# Patient Record
Sex: Male | Born: 1960 | Race: White | Hispanic: No | Smoking: Current every day smoker
Health system: Southern US, Community
[De-identification: ages and names within clinical notes are randomized; demographics above are authoritative.]

## PROBLEM LIST (undated history)

## (undated) DIAGNOSIS — E119 Type 2 diabetes mellitus without complications: Secondary | ICD-10-CM

## (undated) DIAGNOSIS — F410 Panic disorder [episodic paroxysmal anxiety] without agoraphobia: Secondary | ICD-10-CM

## (undated) DIAGNOSIS — F32A Depression, unspecified: Secondary | ICD-10-CM

## (undated) DIAGNOSIS — F419 Anxiety disorder, unspecified: Secondary | ICD-10-CM

## (undated) DIAGNOSIS — E8881 Metabolic syndrome: Secondary | ICD-10-CM

## (undated) DIAGNOSIS — F329 Major depressive disorder, single episode, unspecified: Secondary | ICD-10-CM

## (undated) DIAGNOSIS — E785 Hyperlipidemia, unspecified: Secondary | ICD-10-CM

## (undated) DIAGNOSIS — I517 Cardiomegaly: Secondary | ICD-10-CM

## (undated) DIAGNOSIS — D126 Benign neoplasm of colon, unspecified: Secondary | ICD-10-CM

## (undated) DIAGNOSIS — I4891 Unspecified atrial fibrillation: Secondary | ICD-10-CM

## (undated) HISTORY — PX: COLONOSCOPY W/ POLYPECTOMY: SHX1380

## (undated) HISTORY — DX: Cardiomegaly: I51.7

## (undated) HISTORY — DX: Type 2 diabetes mellitus without complications: E11.9

## (undated) HISTORY — DX: Unspecified atrial fibrillation: I48.91

## (undated) HISTORY — DX: Metabolic syndrome: E88.810

## (undated) HISTORY — DX: Hyperlipidemia, unspecified: E78.5

## (undated) HISTORY — PX: ORIF ANKLE FRACTURE: SUR919

## (undated) HISTORY — DX: Major depressive disorder, single episode, unspecified: F32.9

## (undated) HISTORY — DX: Benign neoplasm of colon, unspecified: D12.6

## (undated) HISTORY — DX: Metabolic syndrome: E88.81

## (undated) HISTORY — DX: Depression, unspecified: F32.A

## (undated) HISTORY — DX: Anxiety disorder, unspecified: F41.9

## (undated) HISTORY — DX: Panic disorder (episodic paroxysmal anxiety): F41.0

---

## 2001-08-27 ENCOUNTER — Emergency Department (HOSPITAL_COMMUNITY): Admission: EM | Admit: 2001-08-27 | Discharge: 2001-08-27 | Payer: Self-pay | Admitting: Emergency Medicine

## 2001-08-29 ENCOUNTER — Encounter: Admission: RE | Admit: 2001-08-29 | Discharge: 2001-08-29 | Payer: Self-pay | Admitting: Family Medicine

## 2001-08-29 ENCOUNTER — Encounter: Payer: Self-pay | Admitting: Family Medicine

## 2001-10-11 ENCOUNTER — Encounter: Payer: Self-pay | Admitting: Family Medicine

## 2001-10-11 ENCOUNTER — Encounter: Admission: RE | Admit: 2001-10-11 | Discharge: 2001-10-11 | Payer: Self-pay | Admitting: Family Medicine

## 2001-10-12 ENCOUNTER — Emergency Department (HOSPITAL_COMMUNITY): Admission: EM | Admit: 2001-10-12 | Discharge: 2001-10-12 | Payer: Self-pay | Admitting: Emergency Medicine

## 2002-06-12 ENCOUNTER — Emergency Department (HOSPITAL_COMMUNITY): Admission: EM | Admit: 2002-06-12 | Discharge: 2002-06-12 | Payer: Self-pay | Admitting: *Deleted

## 2002-06-12 ENCOUNTER — Encounter: Payer: Self-pay | Admitting: Emergency Medicine

## 2005-04-21 ENCOUNTER — Encounter: Payer: Self-pay | Admitting: Cardiology

## 2005-11-21 ENCOUNTER — Emergency Department (HOSPITAL_COMMUNITY): Admission: EM | Admit: 2005-11-21 | Discharge: 2005-11-22 | Payer: Self-pay | Admitting: Emergency Medicine

## 2010-02-08 ENCOUNTER — Encounter: Payer: Self-pay | Admitting: Cardiology

## 2010-02-10 ENCOUNTER — Encounter: Payer: Self-pay | Admitting: Cardiology

## 2010-03-15 ENCOUNTER — Inpatient Hospital Stay (HOSPITAL_COMMUNITY): Admission: EM | Admit: 2010-03-15 | Discharge: 2010-03-18 | Payer: Self-pay | Admitting: Emergency Medicine

## 2010-05-06 ENCOUNTER — Encounter: Payer: Self-pay | Admitting: Cardiology

## 2010-05-06 DIAGNOSIS — E785 Hyperlipidemia, unspecified: Secondary | ICD-10-CM | POA: Insufficient documentation

## 2010-05-06 DIAGNOSIS — I517 Cardiomegaly: Secondary | ICD-10-CM | POA: Insufficient documentation

## 2010-05-06 DIAGNOSIS — E8881 Metabolic syndrome: Secondary | ICD-10-CM | POA: Insufficient documentation

## 2010-05-06 DIAGNOSIS — F410 Panic disorder [episodic paroxysmal anxiety] without agoraphobia: Secondary | ICD-10-CM | POA: Insufficient documentation

## 2010-05-06 DIAGNOSIS — F341 Dysthymic disorder: Secondary | ICD-10-CM | POA: Insufficient documentation

## 2010-05-06 DIAGNOSIS — I4891 Unspecified atrial fibrillation: Secondary | ICD-10-CM | POA: Insufficient documentation

## 2010-05-09 ENCOUNTER — Ambulatory Visit: Payer: Self-pay | Admitting: Cardiology

## 2010-05-09 ENCOUNTER — Encounter: Payer: Self-pay | Admitting: Cardiology

## 2010-05-23 ENCOUNTER — Ambulatory Visit: Payer: Self-pay

## 2010-05-23 ENCOUNTER — Ambulatory Visit (HOSPITAL_COMMUNITY)
Admission: RE | Admit: 2010-05-23 | Discharge: 2010-05-23 | Payer: Self-pay | Source: Home / Self Care | Admitting: Cardiology

## 2010-05-23 ENCOUNTER — Encounter: Payer: Self-pay | Admitting: Cardiology

## 2010-06-15 ENCOUNTER — Ambulatory Visit: Payer: Self-pay | Admitting: Cardiology

## 2010-07-19 NOTE — Letter (Signed)
Summary: [facility]  [facility]   Imported By: Marylou Mccoy 05/19/2010 14:56:01  _____________________________________________________________________  External Attachment:    Type:   Image     Comment:   External Document
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
## 2010-07-19 NOTE — Letter (Signed)
Summary: [facility]  [facility]   Imported By: Marylou Mccoy 05/19/2010 14:43:50  _____________________________________________________________________  External Attachment:    Type:   Image     Comment:   External Document

## 2010-07-21 NOTE — Assessment & Plan Note (Signed)
Summary: f6-8w/dfg   Visit Type:  2 mo f/u Referring Provider:  Dr. Tanya Nones Primary Provider:  Dr. Tanya Nones  CC:  denies any cardiac complaints today.  History of Present Illness: Steven Klein returns today for paroxysmal atrial fibrillation, new onset hypertension.  His echocardiogram showed some mild LVH and a left atrial size of 39 mm. This is upper limits of normal.  We took him off anticoagulation and put him on aspirin.  He's had no further symptoms of atrial fib. Stress levels have dropped.  Current Medications (verified): 1)  Crestor 20 Mg Tabs (Rosuvastatin Calcium) .Marland Kitchen.. 1 Tab At Bedtime 2)  Tricor 145 Mg Tabs (Fenofibrate) .Marland Kitchen.. 1 Tab Once Daily 3)  Paxil Cr 25 Mg Xr24h-Tab (Paroxetine Hcl) .Marland Kitchen.. 1 Tab Once Daily 4)  Metoprolol Succinate 25 Mg Xr24h-Tab (Metoprolol Succinate) .Marland Kitchen.. 1 Tab Once Daily 5)  Aspirin Ec 325 Mg Tbec (Aspirin) .... Take One Tablet By Mouth Daily 6)  Alprazolam 0.5 Mg Tabs (Alprazolam) .... 1/2 Tab As Needed  Allergies: 1)  ! Sulfa  Past History:  Past Medical History: Last updated: 05/06/2010 ATRIAL FIBRILLATION (ICD-427.31) CARDIOMEGALY (ICD-429.3) SHORTNESS OF BREATH (ICD-786.05) METABOLIC SYNDROME X (ICD-277.7) HYPERLIPIDEMIA (ICD-272.4) PANIC ATTACK (ICD-300.01) COLONIC POLYPS (ICD-211.3) DEPRESSION/ANXIETY (ICD-300.4)  Past Surgical History: Last updated: 05/06/2010  1. Open reduction left tibio-talar (ankle)dislocation.   2. Open reduction of left subtalar and talonavicular dislocations.   3. Open reduction and internal fixation of left talar head fracture.   4. Open reduction and internal fixation of left talar dome fracture.   5. Intraoperative interpretation of fluoroscopic images greater than 1       hour.   6. Microfracture of the talar dome.      SURGEON:  Toni Arthurs, MD   Family History: Last updated: 05/06/2010 He does not know his biologic parents, and he says that   otherwise he does not have any history of cardiac  disease or diabetes  known in the family.  He was basically adopted by his grandparents.   Social History: Last updated: 05/06/2010  He smokes approximately 1-1/2.  A tobacco intervention   session was performed, and I recommended that he quit smoking.  He will  be using a patch while he is here in the hospital, and hopefully will be able to cut down over the course of time.        Review of Systems       negative history of present illness  Vital Signs:  Patient profile:   50 year old male Height:      67 inches Weight:      236.25 pounds BMI:     37.14 Pulse rate:   58 / minute Pulse rhythm:   irregular BP sitting:   112 / 72  (left arm) Cuff size:   large  Vitals Entered By: Danielle Rankin, CMA (June 15, 2010 9:40 AM)  Physical Exam  General:  obese.   Head:  normocephalic and atraumatic Eyes:  PERRLA/EOM intact; conjunctiva and lids normal. Neck:  Neck supple, no JVD. No masses, thyromegaly or abnormal cervical nodes. Chest Steven Klein:  no deformities or breast masses noted Lungs:  Clear bilaterally to auscultation and percussion. Heart:  PMI nondisplaced, regular rate and rhythm, normal S1-S2, no carotid bruits Msk:  Back normal, normal gait. Muscle strength and tone normal. Pulses:  pulses normal in all 4 extremities Extremities:  No clubbing or cyanosis. Neurologic:  Alert and oriented x 3. Psych:  Normal affect.   Impression &  Recommendations:  Problem # 1:  ATRIAL FIBRILLATION (ICD-427.31) Assessment Improved no change in treatment. Unless he has recurrent atrial fib, we'll see him back p.r.n. Blood pressure control is essential as he and I and his wife discussed today. His updated medication list for this problem includes:    Metoprolol Succinate 25 Mg Xr24h-tab (Metoprolol succinate) .Marland Kitchen... 1 tab once daily    Aspirin Ec 325 Mg Tbec (Aspirin) .Marland Kitchen... Take one tablet by mouth daily  Appended Document: f6-8w/dfg    Clinical Lists Changes  Observations: Added  new observation of PI CARDIO: Your physician recommends that you schedule a follow-up appointment in: as needed with Dr. Daleen Squibb Your physician recommends that you continue on your current medications as directed. Please refer to the Current Medication list given to you today. Your physician has requested that you regularly monitor and record your blood pressure readings at home.  Please use the same machine at the same time of day to check your readings. (06/15/2010 10:02)       Patient Instructions: 1)  Your physician recommends that you schedule a follow-up appointment in: as needed with Dr. Daleen Squibb 2)  Your physician recommends that you continue on your current medications as directed. Please refer to the Current Medication list given to you today. 3)  Your physician has requested that you regularly monitor and record your blood pressure readings at home.  Please use the same machine at the same time of day to check your readings.

## 2010-09-01 LAB — CBC
HCT: 38.7 % — ABNORMAL LOW (ref 39.0–52.0)
HCT: 43.3 % (ref 39.0–52.0)
Hemoglobin: 12.9 g/dL — ABNORMAL LOW (ref 13.0–17.0)
MCHC: 33.3 g/dL (ref 30.0–36.0)
MCHC: 35.2 g/dL (ref 30.0–36.0)
MCV: 86.9 fL (ref 78.0–100.0)
MCV: 88.4 fL (ref 78.0–100.0)
Platelets: 235 10*3/uL (ref 150–400)
RBC: 4.98 MIL/uL (ref 4.22–5.81)
RDW: 12.8 % (ref 11.5–15.5)
RDW: 13 % (ref 11.5–15.5)
WBC: 15.4 10*3/uL — ABNORMAL HIGH (ref 4.0–10.5)
WBC: 17.1 10*3/uL — ABNORMAL HIGH (ref 4.0–10.5)
WBC: 22.1 10*3/uL — ABNORMAL HIGH (ref 4.0–10.5)

## 2010-09-01 LAB — BASIC METABOLIC PANEL
BUN: 9 mg/dL (ref 6–23)
BUN: 9 mg/dL (ref 6–23)
Calcium: 9.4 mg/dL (ref 8.4–10.5)
Chloride: 103 mEq/L (ref 96–112)
Chloride: 98 mEq/L (ref 96–112)
GFR calc Af Amer: >60 mL/min (ref >60)
GFR calc non Af Amer: >60 mL/min (ref >60)
GFR calc non Af Amer: >60 mL/min (ref >60)
GFR calc non Af Amer: >60 mL/min (ref >60)
Glucose, Bld: 130 mg/dL — ABNORMAL HIGH (ref 70–99)
Glucose, Bld: 141 mg/dL — ABNORMAL HIGH (ref 70–99)
Potassium: 3.5 mEq/L (ref 3.5–5.1)
Potassium: 4.1 mEq/L (ref 3.5–5.1)
Potassium: 4.1 mEq/L (ref 3.5–5.1)
Sodium: 133 mEq/L — ABNORMAL LOW (ref 135–145)
Sodium: 133 mEq/L — ABNORMAL LOW (ref 135–145)

## 2010-09-01 LAB — DIFFERENTIAL
Basophils Absolute: 0 10*3/uL (ref 0.0–0.1)
Basophils Relative: 0 % (ref 0–1)
Lymphocytes Relative: 9 % — ABNORMAL LOW (ref 12–46)
Monocytes Absolute: 0.6 10*3/uL (ref 0.1–1.0)
Neutro Abs: 14.9 10*3/uL — ABNORMAL HIGH (ref 1.7–7.7)
Neutrophils Relative %: 87 % — ABNORMAL HIGH (ref 43–77)

## 2011-01-25 ENCOUNTER — Encounter: Payer: Self-pay | Admitting: Gastroenterology

## 2011-02-22 ENCOUNTER — Ambulatory Visit (INDEPENDENT_AMBULATORY_CARE_PROVIDER_SITE_OTHER): Payer: BC Managed Care – PPO | Admitting: Gastroenterology

## 2011-02-22 ENCOUNTER — Encounter: Payer: Self-pay | Admitting: Gastroenterology

## 2011-02-22 VITALS — BP 120/80 | HR 56 | Ht 67.0 in | Wt 246.8 lb

## 2011-02-22 DIAGNOSIS — K921 Melena: Secondary | ICD-10-CM

## 2011-02-22 DIAGNOSIS — K6289 Other specified diseases of anus and rectum: Secondary | ICD-10-CM

## 2011-02-22 MED ORDER — NA SULFATE-K SULFATE-MG SULF 17.5-3.13-1.6 GM/177ML PO SOLN: 1.0000 | Freq: Every day | ORAL | Status: DC

## 2011-02-22 NOTE — Patient Instructions (Signed)
You have been scheduled for a Colonoscopy. See separate instructions.  Pick up your prep kit from your pharmacy.  High Fiber diet given. cc: Rayne Du, MD

## 2011-02-22 NOTE — Progress Notes (Signed)
History of Present Illness: This is a 50 year old male here today with his wife. He relates the onset of rectal bleeding and rectal pain in April or May of this year. He describes straining with bowel movements and occasional small amounts of bright red blood per rectum. He also occasionally has burning or tearing anal pain associated with bowel movements. He was evaluated by Dr. Tanya Nones who saw no visible lesions but noted a tight anal sphincter and he was treated for a presumed fissure with nitroglycerin ointment, stool softeners and MiraLax. Patient could not tolerate nitroglycerin ointment due to worsening burning anal pain and headaches. He remained on stool softeners and he has noted gradual improvement in symptoms over the course of the summer. For the past 2 weeks he has been asymptomatic. He underwent colonoscopy previously in May 2003 with no pathologic findings. Denies weight loss, abdominal pain, constipation, diarrhea, change in stool caliber, melena, nausea, vomiting, dysphagia, reflux symptoms, chest pain.  Past Medical History  Diagnosis Date  . Atrial fibrillation   . Cardiomegaly   . Metabolic syndrome X   . Hyperlipidemia   . Panic attack   . Anxiety and depression    Past Surgical History  Procedure Date  . Orif ankle fracture     reports that he quit smoking about 11 months ago. He does not have any smokeless tobacco history on file. He reports that he does not drink alcohol or use illicit drugs. family history is negative for Colon cancer.  He is adopted. Allergies  Allergen Reactions  . Sulfonamide Derivatives     REACTION: breathing problems  . Vytorin    Outpatient Encounter Prescriptions as of 02/22/2011  Medication Sig Dispense Refill  . ALPRAZolam (XANAX) 0.5 MG tablet Take 0.25 mg by mouth at bedtime as needed.        Marland Kitchen aspirin 325 MG tablet Take 325 mg by mouth daily.        Marland Kitchen docusate sodium (STOOL SOFTENER) 100 MG capsule Take one tablet by mouth in the  morning and two tablets by mouth at night.       . fenofibrate (TRICOR) 145 MG tablet Take 145 mg by mouth daily.        . metoprolol (LOPRESSOR) 50 MG tablet Take 25 mg by mouth 2 (two) times daily.        Marland Kitchen PARoxetine (PAXIL-CR) 25 MG 24 hr tablet Take 25 mg by mouth every morning.        . rosuvastatin (CRESTOR) 20 MG tablet Take 20 mg by mouth daily.         Review of Systems: Pertinent positive and negative review of systems were noted in the above HPI section. All other review of systems were otherwise negative.  Physical Exam: General: Well developed , well nourished, overweight, no acute distress Head: Normocephalic and atraumatic Eyes:  sclerae anicteric, EOMI Ears: Normal auditory acuity Mouth: No deformity or lesions Neck: Supple, no masses or thyromegaly Lungs: Clear throughout to auscultation Heart: Regular rate and rhythm; no murmurs, rubs or bruits Abdomen: Soft, protuberant, non tender and non distended. No masses, hepatosplenomegaly or hernias noted. Normal Bowel sounds Rectal: Deferred to colonoscopy Musculoskeletal: Symmetrical with no gross deformities  Skin: No lesions on visible extremities Pulses:  Normal pulses noted Extremities: No clubbing, cyanosis, edema or deformities noted Neurological: Alert oriented x 4, grossly nonfocal Cervical Nodes:  No significant cervical adenopathy Inguinal Nodes: No significant inguinal adenopathy Psychological:  Alert and cooperative. Normal mood and affect  Assessment and Recommendations:  1. Hematochezia and anal pain. R/O an anal fissure or hemorrhoids. Rule out inflammatory bowel disease and colorectal neoplasms. Continue stool softeners and begin a high fiber diet with increased water intake. The risks, benefits, and alternatives to colonoscopy with possible biopsy and possible polypectomy were discussed with the patient and they consent to proceed.

## 2011-02-23 ENCOUNTER — Encounter: Payer: Self-pay | Admitting: Gastroenterology

## 2011-03-07 ENCOUNTER — Ambulatory Visit (AMBULATORY_SURGERY_CENTER): Payer: BC Managed Care – PPO | Admitting: Gastroenterology

## 2011-03-07 ENCOUNTER — Encounter: Payer: Self-pay | Admitting: Gastroenterology

## 2011-03-07 DIAGNOSIS — K6289 Other specified diseases of anus and rectum: Secondary | ICD-10-CM

## 2011-03-07 DIAGNOSIS — D126 Benign neoplasm of colon, unspecified: Secondary | ICD-10-CM

## 2011-03-07 DIAGNOSIS — K921 Melena: Secondary | ICD-10-CM

## 2011-03-07 MED ORDER — SODIUM CHLORIDE 0.9 % IV SOLN: 500.0000 mL | INTRAVENOUS | Status: DC

## 2011-03-07 NOTE — Progress Notes (Signed)
Addended by: Inocente Salles on: 03/07/2011 05:22 PM   Modules accepted: Orders

## 2011-03-07 NOTE — Progress Notes (Signed)
Pt passed large amount of air while in the recovery room

## 2011-03-07 NOTE — Patient Instructions (Signed)
Please review blue and green discharge instructions  Await pathology

## 2011-03-08 ENCOUNTER — Telehealth: Payer: Self-pay

## 2011-03-08 NOTE — Telephone Encounter (Signed)
Follow up Call- Patient questions:  Do you have a fever, pain , or abdominal swelling? no Pain Score  0 *  Have you tolerated food without any problems? yes  Have you been able to return to your normal activities? yes  Do you have any questions about your discharge instructions: Diet   no Medications  no Follow up visit  no  Do you have questions or concerns about your Care? no  Actions: * If pain score is 4 or above: No action needed, pain <4. "Everything went real well" per the pt. maw

## 2011-03-13 ENCOUNTER — Encounter: Payer: Self-pay | Admitting: Gastroenterology

## 2012-10-24 ENCOUNTER — Telehealth: Payer: Self-pay | Admitting: Family Medicine

## 2012-10-24 MED ORDER — ALPRAZOLAM 0.5 MG PO TABS: 0.5000 mg | ORAL_TABLET | Freq: Three times a day (TID) | ORAL | Status: DC

## 2012-10-24 NOTE — Telephone Encounter (Signed)
Ok to refill 

## 2012-10-24 NOTE — Telephone Encounter (Signed)
Rx Refilled  

## 2012-10-24 NOTE — Telephone Encounter (Signed)
To refill.  

## 2012-12-23 ENCOUNTER — Other Ambulatory Visit: Payer: Self-pay | Admitting: Family Medicine

## 2012-12-23 NOTE — Telephone Encounter (Signed)
Ok to refill x 2  

## 2012-12-23 NOTE — Telephone Encounter (Signed)
?   OK to Refill  

## 2013-01-17 ENCOUNTER — Telehealth: Payer: Self-pay | Admitting: Family Medicine

## 2013-01-17 MED ORDER — METOPROLOL SUCCINATE ER 50 MG PO TB24: 50.0000 mg | ORAL_TABLET | Freq: Every day | ORAL | Status: DC

## 2013-01-17 NOTE — Telephone Encounter (Signed)
Rx Refilled  

## 2013-01-21 ENCOUNTER — Encounter: Payer: Self-pay | Admitting: Family Medicine

## 2013-01-21 ENCOUNTER — Telehealth: Payer: Self-pay | Admitting: Family Medicine

## 2013-01-21 MED ORDER — SIMVASTATIN 40 MG PO TABS: 40.0000 mg | ORAL_TABLET | Freq: Every evening | ORAL | Status: DC

## 2013-01-21 MED ORDER — PAROXETINE HCL ER 25 MG PO TB24: 25.0000 mg | ORAL_TABLET | ORAL | Status: DC

## 2013-01-21 NOTE — Telephone Encounter (Signed)
Medication refill for one time only.  Patient needs to be seen.  Letter sent for patient to call and schedule 

## 2013-02-26 ENCOUNTER — Other Ambulatory Visit: Payer: Self-pay | Admitting: Family Medicine

## 2013-02-26 ENCOUNTER — Telehealth: Payer: Self-pay | Admitting: Family Medicine

## 2013-02-26 MED ORDER — SIMVASTATIN 40 MG PO TABS: ORAL_TABLET | ORAL | Status: DC

## 2013-02-26 NOTE — Telephone Encounter (Signed)
Rx Refilled  

## 2013-02-26 NOTE — Telephone Encounter (Signed)
Simvastatin 40 mg tab 1 QHS #30

## 2013-06-09 ENCOUNTER — Other Ambulatory Visit: Payer: Self-pay | Admitting: Family Medicine

## 2013-06-09 NOTE — Telephone Encounter (Signed)
ok 

## 2013-06-09 NOTE — Telephone Encounter (Signed)
?   OK to Refill  

## 2013-08-27 ENCOUNTER — Other Ambulatory Visit: Payer: Self-pay | Admitting: Family Medicine

## 2013-09-25 ENCOUNTER — Other Ambulatory Visit: Payer: BC Managed Care – PPO

## 2013-09-25 DIAGNOSIS — Z79899 Other long term (current) drug therapy: Secondary | ICD-10-CM

## 2013-09-25 DIAGNOSIS — I4891 Unspecified atrial fibrillation: Secondary | ICD-10-CM

## 2013-09-25 DIAGNOSIS — R7301 Impaired fasting glucose: Secondary | ICD-10-CM

## 2013-09-25 DIAGNOSIS — E782 Mixed hyperlipidemia: Secondary | ICD-10-CM

## 2013-09-25 DIAGNOSIS — E8881 Metabolic syndrome: Secondary | ICD-10-CM

## 2013-09-25 DIAGNOSIS — Z125 Encounter for screening for malignant neoplasm of prostate: Secondary | ICD-10-CM

## 2013-09-25 LAB — CBC WITH DIFFERENTIAL/PLATELET
BASOS PCT: 1 % (ref 0–1)
Basophils Absolute: 0.1 10*3/uL (ref 0.0–0.1)
Eosinophils Absolute: 0.2 10*3/uL (ref 0.0–0.7)
Eosinophils Relative: 3 % (ref 0–5)
HEMATOCRIT: 41.3 % (ref 39.0–52.0)
HEMOGLOBIN: 14.1 g/dL (ref 13.0–17.0)
LYMPHS PCT: 25 % (ref 12–46)
Lymphs Abs: 1.7 10*3/uL (ref 0.7–4.0)
MCH: 28.7 pg (ref 26.0–34.0)
MCHC: 34.1 g/dL (ref 30.0–36.0)
MCV: 84.1 fL (ref 78.0–100.0)
MONO ABS: 0.4 10*3/uL (ref 0.1–1.0)
MONOS PCT: 6 % (ref 3–12)
NEUTROS ABS: 4.5 10*3/uL (ref 1.7–7.7)
NEUTROS PCT: 65 % (ref 43–77)
Platelets: 217 10*3/uL (ref 150–400)
RBC: 4.91 MIL/uL (ref 4.22–5.81)
RDW: 14.1 % (ref 11.5–15.5)
WBC: 6.9 10*3/uL (ref 4.0–10.5)

## 2013-09-25 LAB — COMPLETE METABOLIC PANEL WITH GFR
ALT: 26 U/L (ref 0–53)
AST: 21 U/L (ref 0–37)
Albumin: 4 g/dL (ref 3.5–5.2)
Alkaline Phosphatase: 100 U/L (ref 39–117)
BILIRUBIN TOTAL: 0.9 mg/dL (ref 0.2–1.2)
BUN: 9 mg/dL (ref 6–23)
CALCIUM: 9.4 mg/dL (ref 8.4–10.5)
CHLORIDE: 102 meq/L (ref 96–112)
CO2: 27 meq/L (ref 19–32)
CREATININE: 0.88 mg/dL (ref 0.50–1.35)
GFR, Est Non African American: >89 mL/min
Glucose, Bld: 136 mg/dL — ABNORMAL HIGH (ref 70–99)
Potassium: 4 mEq/L (ref 3.5–5.3)
SODIUM: 141 meq/L (ref 135–145)
Total Protein: 6.6 g/dL (ref 6.0–8.3)

## 2013-09-25 LAB — LIPID PANEL
CHOL/HDL RATIO: 4.1 ratio
CHOLESTEROL: 140 mg/dL (ref 0–200)
HDL: 34 mg/dL — AB (ref >39)
LDL Cholesterol: 59 mg/dL (ref 0–99)
TRIGLYCERIDES: 233 mg/dL — AB (ref <150)
VLDL: 47 mg/dL — ABNORMAL HIGH (ref 0–40)

## 2013-09-25 LAB — HEMOGLOBIN A1C
Hgb A1c MFr Bld: 7.6 % — ABNORMAL HIGH (ref <5.7)
Mean Plasma Glucose: 171 mg/dL — ABNORMAL HIGH (ref <117)

## 2013-09-25 LAB — TSH: TSH: 1.526 u[IU]/mL (ref 0.350–4.500)

## 2013-09-26 LAB — PSA: PSA: 0.25 ng/mL (ref <=4.00)

## 2013-09-30 ENCOUNTER — Encounter: Payer: Self-pay | Admitting: Family Medicine

## 2013-09-30 ENCOUNTER — Ambulatory Visit (INDEPENDENT_AMBULATORY_CARE_PROVIDER_SITE_OTHER): Payer: BC Managed Care – PPO | Admitting: Family Medicine

## 2013-09-30 VITALS — BP 126/76 | HR 68 | Temp 97.4°F | Resp 18 | Ht 67.0 in | Wt 263.0 lb

## 2013-09-30 DIAGNOSIS — E1165 Type 2 diabetes mellitus with hyperglycemia: Principal | ICD-10-CM

## 2013-09-30 DIAGNOSIS — I10 Essential (primary) hypertension: Secondary | ICD-10-CM

## 2013-09-30 DIAGNOSIS — I4891 Unspecified atrial fibrillation: Secondary | ICD-10-CM

## 2013-09-30 DIAGNOSIS — E785 Hyperlipidemia, unspecified: Secondary | ICD-10-CM

## 2013-09-30 DIAGNOSIS — IMO0001 Reserved for inherently not codable concepts without codable children: Secondary | ICD-10-CM

## 2013-09-30 DIAGNOSIS — I480 Paroxysmal atrial fibrillation: Secondary | ICD-10-CM

## 2013-09-30 MED ORDER — METFORMIN HCL 500 MG PO TABS: 500.0000 mg | ORAL_TABLET | Freq: Two times a day (BID) | ORAL | Status: DC

## 2013-09-30 NOTE — Progress Notes (Signed)
Subjective:    Patient ID: Steven Klein, male    DOB: Jun 01, 1961, 53 y.o.   MRN: [id number]  HPI Patient has recently gained a substantial amount of weight due to inactivity. He also does not exercise. He drinks 3 or 4 sodas every day. He eats a high carbohydrate diet. He is here today for a medicine check. He has a history of paroxysmal atrial fibrillation. At present he is in normal sinus rhythm. He denies any palpitations.  He denies any presyncope. He denies any myalgias or right upper quadrant pain on simvastatin. He denies any chest pain or shortness of breath or dyspnea on exertion. He has substantial left ankle pain which prevents him from exercising. At present his depression and anxiety are well controlled. His most recent labwork as listed below. This is significant for an elevated fasting blood sugar and elevated hemoglobin A1c. Lab on 09/25/2013  Component Date Value Ref Range Status  . Cholesterol 09/25/2013 140  0 - 200 mg/dL Final   Comment: ATP III Classification:                                < 200        mg/dL        Desirable                               200 - 239     mg/dL        Borderline High                               >= 240        mg/dL        High                             . Triglycerides 09/25/2013 233* <150 mg/dL Final  . HDL 09/25/2013 34* >39 mg/dL Final  . Total CHOL/HDL Ratio 09/25/2013 4.1   Final  . VLDL 09/25/2013 47* 0 - 40 mg/dL Final  . LDL Cholesterol 09/25/2013 59  0 - 99 mg/dL Final   Comment:                            Total Cholesterol/HDL Ratio:CHD Risk                                                 Coronary Heart Disease Risk Table                                                                 Men       Women                                   1/2 Average Risk  3.4        3.3                                       Average Risk              5.0        4.4                                    2X Average Risk              9.6         7.1                                    3X Average Risk             23.4       11.0                          Use the calculated Patient Ratio above and the CHD Risk table                           to determine the patient's CHD Risk.                          ATP III Classification (LDL):                                < 100        mg/dL         Optimal                               100 - 129     mg/dL         Near or Above Optimal                               130 - 159     mg/dL         Borderline High                               160 - 189     mg/dL         High                                > 190        mg/dL         Very High                             . PSA 09/25/2013 0.25  <=4.00 ng/mL Final   Comment: Test Methodology: ECLIA PSA (Electrochemiluminescence Immunoassay)  For PSA values from 2.5-4.0, particularly in younger men <60 years                          old, the AUA and NCCN suggest testing for % Free PSA (3515) and                          evaluation of the rate of increase in PSA (PSA velocity).  . TSH 09/25/2013 1.526  0.350 - 4.500 uIU/mL Final  . WBC 09/25/2013 6.9  4.0 - 10.5 K/uL Final  . RBC 09/25/2013 4.91  4.22 - 5.81 MIL/uL Final  . Hemoglobin 09/25/2013 14.1  13.0 - 17.0 g/dL Final  . HCT 09/25/2013 41.3  39.0 - 52.0 % Final  . MCV 09/25/2013 84.1  78.0 - 100.0 fL Final  . MCH 09/25/2013 28.7  26.0 - 34.0 pg Final  . MCHC 09/25/2013 34.1  30.0 - 36.0 g/dL Final  . RDW 09/25/2013 14.1  11.5 - 15.5 % Final  . Platelets 09/25/2013 217  150 - 400 K/uL Final  . Neutrophils Relative % 09/25/2013 65  43 - 77 % Final  . Neutro Abs 09/25/2013 4.5  1.7 - 7.7 K/uL Final  . Lymphocytes Relative 09/25/2013 25  12 - 46 % Final  . Lymphs Abs 09/25/2013 1.7  0.7 - 4.0 K/uL Final  . Monocytes Relative 09/25/2013 6  3 - 12 % Final  . Monocytes Absolute 09/25/2013 0.4  0.1 - 1.0 K/uL Final  . Eosinophils Relative  09/25/2013 3  0 - 5 % Final  . Eosinophils Absolute 09/25/2013 0.2  0.0 - 0.7 K/uL Final  . Basophils Relative 09/25/2013 1  0 - 1 % Final  . Basophils Absolute 09/25/2013 0.1  0.0 - 0.1 K/uL Final  . Smear Review 09/25/2013 Criteria for review not met   Final  . Hemoglobin A1C 09/25/2013 7.6* <5.7 % Final   Comment:                                                                                                 According to the ADA Clinical Practice Recommendations for 2011, when                          HbA1c is used as a screening test:                                                       >=6.5%   Diagnostic of Diabetes Mellitus                                     (if abnormal result is confirmed)  5.7-6.4%   Increased risk of developing Diabetes Mellitus                                                     References:Diagnosis and Classification of Diabetes Mellitus,Diabetes                          AOZH,0865,78(IONGE 1):S62-S69 and Standards of Medical Care in                                  Diabetes - 2011,Diabetes XBMW,4132,44 (Suppl 1):S11-S61.                             . Mean Plasma Glucose 09/25/2013 171* <117 mg/dL Final  . Sodium 09/25/2013 141  135 - 145 mEq/L Final  . Potassium 09/25/2013 4.0  3.5 - 5.3 mEq/L Final  . Chloride 09/25/2013 102  96 - 112 mEq/L Final  . CO2 09/25/2013 27  19 - 32 mEq/L Final  . Glucose, Bld 09/25/2013 136* 70 - 99 mg/dL Final  . BUN 09/25/2013 9  6 - 23 mg/dL Final  . Creat 09/25/2013 0.88  0.50 - 1.35 mg/dL Final  . Total Bilirubin 09/25/2013 0.9  0.2 - 1.2 mg/dL Final  . Alkaline Phosphatase 09/25/2013 100  39 - 117 U/L Final  . AST 09/25/2013 21  0 - 37 U/L Final  . ALT 09/25/2013 26  0 - 53 U/L Final  . Total Protein 09/25/2013 6.6  6.0 - 8.3 g/dL Final  . Albumin 09/25/2013 4.0  3.5 - 5.2 g/dL Final  . Calcium 09/25/2013 9.4  8.4 - 10.5 mg/dL Final  . GFR, Est African American  09/25/2013 >89   Final  . GFR, Est Non African American 09/25/2013 >89   Final   Comment:                            The estimated GFR is a calculation valid for adults (>=85 years old)                          that uses the CKD-EPI algorithm to adjust for age and sex. It is                            not to be used for children, pregnant women, hospitalized patients,                             patients on dialysis, or with rapidly changing kidney function.                          According to the NKDEP, eGFR >89 is normal, 60-89 shows mild                          impairment, 30-59 shows moderate impairment, 15-29 shows severe  impairment and <15 is ESRD.                              Past Medical History  Diagnosis Date  . Atrial fibrillation   . Cardiomegaly   . Metabolic syndrome X   . Hyperlipidemia   . Panic attack   . Anxiety and depression   . Depression   . Diabetes mellitus without complication    Past Surgical History  Procedure Laterality Date  . Orif ankle fracture     Current Outpatient Prescriptions on File Prior to Visit  Medication Sig Dispense Refill  . ALPRAZolam (XANAX) 0.5 MG tablet TAKE 1 TABLET BY MOUTH EVERY 8 HOURS AS NEEDED FOR ANXIETY  60 tablet  2  . aspirin 325 MG tablet Take 325 mg by mouth daily.        Marland Kitchen docusate sodium (STOOL SOFTENER) 100 MG capsule Take one tablet by mouth in the morning and two tablets by mouth at night.       Marland Kitchen PARoxetine (PAXIL-CR) 25 MG 24 hr tablet TAKE 1 TABLET EVERY MORNING  30 tablet  1  . simvastatin (ZOCOR) 40 MG tablet TAKE 1 TABLET BY MOUTH AT BEDTIME  30 tablet  5  . PARoxetine (PAXIL-CR) 25 MG 24 hr tablet TAKE 1 TABLET EVERY MORNING  30 tablet  1   No current facility-administered medications on file prior to visit.   Allergies  Allergen Reactions  . Ezetimibe-Simvastatin   . Sulfonamide Derivatives     REACTION: breathing problems   History   Social History  . Marital Status:  Married    Spouse Name: N/A    Number of Children: 1  . Years of Education: N/A   Occupational History  . OWNER     [employer]   Social History Main Topics  . Smoking status: Former Smoker    Quit date: 03/15/2010  . Smokeless tobacco: Not on file  . Alcohol Use: No  . Drug Use: No  . Sexual Activity: Not on file   Other Topics Concern  . Not on file   Social History Narrative  . No narrative on file   Family History  Problem Relation Age of Onset  . Adopted: Yes  . Colon cancer Neg Hx   . Esophageal cancer Neg Hx   . Stomach cancer Neg Hx       Review of Systems  All other systems reviewed and are negative.      Objective:   Physical Exam  Constitutional: He is oriented to person, place, and time. He appears well-developed and well-nourished.  HENT:  Mouth/Throat: Oropharynx is clear and moist.  Eyes: Conjunctivae and EOM are normal. Pupils are equal, round, and reactive to light. No scleral icterus.  Neck: Neck supple. No JVD present. No thyromegaly present.  Cardiovascular: Normal rate, regular rhythm, normal heart sounds and intact distal pulses.  Exam reveals no gallop and no friction rub.   No murmur heard. Pulmonary/Chest: Effort normal and breath sounds normal. No respiratory distress. He has no wheezes. He has no rales. He exhibits no tenderness.  Abdominal: Soft. Bowel sounds are normal. He exhibits no distension and no mass. There is no tenderness. There is no rebound and no guarding.  Musculoskeletal: He exhibits no edema.  Lymphadenopathy:    He has no cervical adenopathy.  Neurological: He is alert and oriented to person, place, and time. He has normal reflexes. He  displays normal reflexes. No cranial nerve deficit. He exhibits normal muscle tone. Coordination normal.  Skin: Skin is warm. No rash noted. No erythema. No pallor.  Psychiatric: He has a normal mood and affect. His behavior is normal. Judgment and thought content normal.            Assessment & Plan:  1. Type II or unspecified type diabetes mellitus without mention of complication, uncontrolled She has a new diagnosis of diabetes mellitus type 2. He is already taking an aspirin. I recommended starting metformin 500 mg by mouth twice a day. I will recheck a hemoglobin A1c in 3 months. I spent 10 minutes discussing low carbohydrate diet and increasing aerobic exercise. - metFORMIN (GLUCOPHAGE) 500 MG tablet; Take 1 tablet (500 mg total) by mouth 2 (two) times daily with a meal.  Dispense: 180 tablet; Refill: 3  2. Paroxysmal atrial fibrillation Patient chads2 score is now 1.  We discussed this and the patient would like to continue aspirin 345 mg by mouth daily  3. HTN (hypertension) Patient's blood pressures well controlled. I will try to get his sugars down diet exercise and medication. It is recheck in 3 months I will check a urine microalbumin. If it is elevated I will add an ACE inhibitor  4. HLD (hyperlipidemia) As his cholesterol reflects elevated triglycerides and low HDL cholesterol. I recommended diet exercise and lifestyle changes to address these. Recheck in 3 months.

## 2013-10-06 ENCOUNTER — Other Ambulatory Visit: Payer: Self-pay | Admitting: Family Medicine

## 2013-12-22 ENCOUNTER — Other Ambulatory Visit: Payer: Self-pay | Admitting: Family Medicine

## 2013-12-23 ENCOUNTER — Other Ambulatory Visit: Payer: BC Managed Care – PPO

## 2013-12-23 DIAGNOSIS — I4891 Unspecified atrial fibrillation: Secondary | ICD-10-CM

## 2013-12-23 DIAGNOSIS — E785 Hyperlipidemia, unspecified: Secondary | ICD-10-CM

## 2013-12-23 LAB — CBC WITH DIFFERENTIAL/PLATELET
BASOS PCT: 1 % (ref 0–1)
Basophils Absolute: 0.1 10*3/uL (ref 0.0–0.1)
EOS ABS: 0.3 10*3/uL (ref 0.0–0.7)
EOS PCT: 3 % (ref 0–5)
HCT: 47.9 % (ref 39.0–52.0)
HEMOGLOBIN: 16.6 g/dL (ref 13.0–17.0)
LYMPHS ABS: 2.3 10*3/uL (ref 0.7–4.0)
Lymphocytes Relative: 26 % (ref 12–46)
MCH: 28.5 pg (ref 26.0–34.0)
MCHC: 34.7 g/dL (ref 30.0–36.0)
MCV: 82.3 fL (ref 78.0–100.0)
MONOS PCT: 7 % (ref 3–12)
Monocytes Absolute: 0.6 10*3/uL (ref 0.1–1.0)
Neutro Abs: 5.5 10*3/uL (ref 1.7–7.7)
Neutrophils Relative %: 63 % (ref 43–77)
Platelets: 286 10*3/uL (ref 150–400)
RBC: 5.82 MIL/uL — AB (ref 4.22–5.81)
RDW: 13.9 % (ref 11.5–15.5)
WBC: 8.7 10*3/uL (ref 4.0–10.5)

## 2013-12-23 LAB — COMPLETE METABOLIC PANEL WITH GFR
ALK PHOS: 86 U/L (ref 39–117)
ALT: 14 U/L (ref 0–53)
AST: 15 U/L (ref 0–37)
Albumin: 4.4 g/dL (ref 3.5–5.2)
BILIRUBIN TOTAL: 1.1 mg/dL (ref 0.2–1.2)
BUN: 12 mg/dL (ref 6–23)
CO2: 25 mEq/L (ref 19–32)
CREATININE: 0.82 mg/dL (ref 0.50–1.35)
Calcium: 9.5 mg/dL (ref 8.4–10.5)
Chloride: 104 mEq/L (ref 96–112)
GFR, Est Non African American: >89 mL/min
Glucose, Bld: 89 mg/dL (ref 70–99)
Potassium: 4.5 mEq/L (ref 3.5–5.3)
SODIUM: 140 meq/L (ref 135–145)
Total Protein: 6.8 g/dL (ref 6.0–8.3)

## 2013-12-23 LAB — LIPID PANEL
CHOL/HDL RATIO: 3.8 ratio
Cholesterol: 133 mg/dL (ref 0–200)
HDL: 35 mg/dL — ABNORMAL LOW (ref >39)
LDL CALC: 74 mg/dL (ref 0–99)
TRIGLYCERIDES: 120 mg/dL (ref <150)
VLDL: 24 mg/dL (ref 0–40)

## 2013-12-23 LAB — HEMOGLOBIN A1C
Hgb A1c MFr Bld: 5.8 % — ABNORMAL HIGH (ref <5.7)
Mean Plasma Glucose: 120 mg/dL — ABNORMAL HIGH (ref <117)

## 2013-12-25 ENCOUNTER — Other Ambulatory Visit: Payer: Self-pay | Admitting: Family Medicine

## 2013-12-25 NOTE — Telephone Encounter (Signed)
?   OK to Refill  

## 2013-12-25 NOTE — Telephone Encounter (Signed)
ok 

## 2013-12-30 ENCOUNTER — Encounter: Payer: Self-pay | Admitting: Family Medicine

## 2013-12-30 ENCOUNTER — Ambulatory Visit (INDEPENDENT_AMBULATORY_CARE_PROVIDER_SITE_OTHER): Payer: BC Managed Care – PPO | Admitting: Family Medicine

## 2013-12-30 VITALS — BP 104/64 | HR 58 | Temp 98.6°F | Resp 16 | Ht 68.0 in | Wt 221.0 lb

## 2013-12-30 DIAGNOSIS — I4891 Unspecified atrial fibrillation: Secondary | ICD-10-CM

## 2013-12-30 DIAGNOSIS — I10 Essential (primary) hypertension: Secondary | ICD-10-CM

## 2013-12-30 DIAGNOSIS — E785 Hyperlipidemia, unspecified: Secondary | ICD-10-CM

## 2013-12-30 DIAGNOSIS — E1165 Type 2 diabetes mellitus with hyperglycemia: Principal | ICD-10-CM

## 2013-12-30 DIAGNOSIS — I480 Paroxysmal atrial fibrillation: Secondary | ICD-10-CM

## 2013-12-30 DIAGNOSIS — IMO0001 Reserved for inherently not codable concepts without codable children: Secondary | ICD-10-CM

## 2013-12-30 NOTE — Progress Notes (Signed)
Subjective:    Patient ID: Steven Klein, male    DOB: 08/30/1960, 53 y.o.   MRN: [id number]  HPI  Patient is here today for recheck of his diabetes mellitus type 2. Since his last office visit, the patient has lost between 30 and 40 pounds due to diet exercise and lifestyle changes. I am extremely proud of the patient for doing this. His hemoglobin A1c has dropped to 5.8 which is outstanding.  He denies any hypoglycemic episodes. He denies any polyuria or polydipsia. His vision screening is up to date. His diabetic foot exam is normal. He is also taking aspirin 325 mg by mouth daily for paroxysmal atrial fibrillation. Currently his [place] score is 1.  He denies palpitations or syncope or presyncope. He denies racing heartbeat. His heart rate is well controlled with Toprol-XL   He also has a history of hyperlipidemia currently managed with simvastatin 40 mg by mouth daily. He denies any myalgias or right quadrant pain. His most recent lab work is listed below: Lab on 12/23/2013  Component Date Value Ref Range Status  . WBC 12/23/2013 8.7  4.0 - 10.5 K/uL Final  . RBC 12/23/2013 5.82* 4.22 - 5.81 MIL/uL Final  . Hemoglobin 12/23/2013 16.6  13.0 - 17.0 g/dL Final  . HCT 12/23/2013 47.9  39.0 - 52.0 % Final  . MCV 12/23/2013 82.3  78.0 - 100.0 fL Final  . MCH 12/23/2013 28.5  26.0 - 34.0 pg Final  . MCHC 12/23/2013 34.7  30.0 - 36.0 g/dL Final  . RDW 12/23/2013 13.9  11.5 - 15.5 % Final  . Platelets 12/23/2013 286  150 - 400 K/uL Final  . Neutrophils Relative % 12/23/2013 63  43 - 77 % Final  . Neutro Abs 12/23/2013 5.5  1.7 - 7.7 K/uL Final  . Lymphocytes Relative 12/23/2013 26  12 - 46 % Final  . Lymphs Abs 12/23/2013 2.3  0.7 - 4.0 K/uL Final  . Monocytes Relative 12/23/2013 7  3 - 12 % Final  . Monocytes Absolute 12/23/2013 0.6  0.1 - 1.0 K/uL Final  . Eosinophils Relative 12/23/2013 3  0 - 5 % Final  . Eosinophils Absolute 12/23/2013 0.3  0.0 - 0.7 K/uL Final  . Basophils Relative  12/23/2013 1  0 - 1 % Final  . Basophils Absolute 12/23/2013 0.1  0.0 - 0.1 K/uL Final  . Smear Review 12/23/2013 Criteria for review not met   Final  . Hemoglobin A1C 12/23/2013 5.8* <5.7 % Final   Comment:                                                                                                 According to the ADA Clinical Practice Recommendations for 2011, when                          HbA1c is used as a screening test:                                                       >=  6.5%   Diagnostic of Diabetes Mellitus                                     (if abnormal result is confirmed)                                                     5.7-6.4%   Increased risk of developing Diabetes Mellitus                                                     References:Diagnosis and Classification of Diabetes Mellitus,Diabetes                          RSWN,4627,03(JKKXF 1):S62-S69 and Standards of Medical Care in                                  Diabetes - 2011,Diabetes Care,2011,34 (Suppl 1):S11-S61.                             . Mean Plasma Glucose 12/23/2013 120* <117 mg/dL Final  . Cholesterol 12/23/2013 133  0 - 200 mg/dL Final   Comment: ATP III Classification:                                < 200        mg/dL        Desirable                               200 - 239     mg/dL        Borderline High                               >= 240        mg/dL        High                             . Triglycerides 12/23/2013 120  <150 mg/dL Final  . HDL 12/23/2013 35* >39 mg/dL Final  . Total CHOL/HDL Ratio 12/23/2013 3.8   Final  . VLDL 12/23/2013 24  0 - 40 mg/dL Final  . LDL Cholesterol 12/23/2013 74  0 - 99 mg/dL Final   Comment:                            Total Cholesterol/HDL Ratio:CHD Risk                                                 Coronary Heart Disease Risk  Table                                                                 Men       Women                                   1/2  Average Risk              3.4        3.3                                       Average Risk              5.0        4.4                                    2X Average Risk              9.6        7.1                                    3X Average Risk             23.4       11.0                          Use the calculated Patient Ratio above and the CHD Risk table                           to determine the patient's CHD Risk.                          ATP III Classification (LDL):                                < 100        mg/dL         Optimal                               100 - 129     mg/dL         Near or Above Optimal                               130 - 159     mg/dL         Borderline High                               160 - 189     mg/dL  High                                > 190        mg/dL         Very High                             . Sodium 12/23/2013 140  135 - 145 mEq/L Final  . Potassium 12/23/2013 4.5  3.5 - 5.3 mEq/L Final  . Chloride 12/23/2013 104  96 - 112 mEq/L Final  . CO2 12/23/2013 25  19 - 32 mEq/L Final  . Glucose, Bld 12/23/2013 89  70 - 99 mg/dL Final  . BUN 12/23/2013 12  6 - 23 mg/dL Final  . Creat 12/23/2013 0.82  0.50 - 1.35 mg/dL Final  . Total Bilirubin 12/23/2013 1.1  0.2 - 1.2 mg/dL Final  . Alkaline Phosphatase 12/23/2013 86  39 - 117 U/L Final  . AST 12/23/2013 15  0 - 37 U/L Final  . ALT 12/23/2013 14  0 - 53 U/L Final  . Total Protein 12/23/2013 6.8  6.0 - 8.3 g/dL Final  . Albumin 12/23/2013 4.4  3.5 - 5.2 g/dL Final  . Calcium 12/23/2013 9.5  8.4 - 10.5 mg/dL Final  . GFR, Est African American 12/23/2013 >89   Final  . GFR, Est Non African American 12/23/2013 >89   Final   Comment:                            The estimated GFR is a calculation valid for adults (>=60 years old)                          that uses the CKD-EPI algorithm to adjust for age and sex. It is                            not to be used for children, pregnant women,  hospitalized patients,                             patients on dialysis, or with rapidly changing kidney function.                          According to the NKDEP, eGFR >89 is normal, 60-89 shows mild                          impairment, 30-59 shows moderate impairment, 15-29 shows severe                          impairment and <15 is ESRD.                              Past Medical History  Diagnosis Date  . Atrial fibrillation   . Cardiomegaly   . Metabolic syndrome X   . Hyperlipidemia   . Panic attack   . Anxiety and depression   . Depression   . Diabetes mellitus without complication    Past Surgical History  Procedure Laterality Date  .  Orif ankle fracture     Current Outpatient Prescriptions on File Prior to Visit  Medication Sig Dispense Refill  . ALPRAZolam (XANAX) 0.5 MG tablet TAKE 1 TABLET BY MOUTH EVERY 8 HOURS AS NEEDED FOR ANXIETY  60 tablet  0  . metFORMIN (GLUCOPHAGE) 500 MG tablet Take 1 tablet (500 mg total) by mouth 2 (two) times daily with a meal.  180 tablet  3  . metoprolol succinate (TOPROL-XL) 50 MG 24 hr tablet Take 50 mg by mouth daily. 1/2 tab po qd      . PARoxetine (PAXIL-CR) 25 MG 24 hr tablet TAKE 1 TABLET EVERY MORNING  30 tablet  1  . simvastatin (ZOCOR) 40 MG tablet TAKE 1 TABLET BY MOUTH AT BEDTIME  30 tablet  5  . aspirin 325 MG tablet Take 325 mg by mouth daily.        Marland Kitchen docusate sodium (STOOL SOFTENER) 100 MG capsule Take one tablet by mouth in the morning and two tablets by mouth at night.        No current facility-administered medications on file prior to visit.   Allergies  Allergen Reactions  . Ezetimibe-Simvastatin   . Sulfonamide Derivatives     REACTION: breathing problems   History   Social History  . Marital Status: Married    Spouse Name: N/A    Number of Children: 1  . Years of Education: N/A   Occupational History  . OWNER     [employer]   Social History Main Topics  . Smoking status: Former Smoker    Quit date:  03/15/2010  . Smokeless tobacco: Not on file  . Alcohol Use: No  . Drug Use: No  . Sexual Activity: Not on file   Other Topics Concern  . Not on file   Social History Narrative  . No narrative on file     Review of Systems  All other systems reviewed and are negative.      Objective:   Physical Exam  Vitals reviewed. Neck: Neck supple. No JVD present. No thyromegaly present.  Cardiovascular: Normal rate and normal heart sounds.  An irregularly irregular rhythm present.  Pulmonary/Chest: Effort normal and breath sounds normal. No respiratory distress. He has no wheezes. He has no rales.  Abdominal: Soft. Bowel sounds are normal. He exhibits no distension and no mass. There is no tenderness. There is no rebound and no guarding.  Musculoskeletal: He exhibits no edema.  Lymphadenopathy:    He has no cervical adenopathy.          Assessment & Plan:  1. Type II or unspecified type diabetes mellitus without mention of complication, uncontrolled Diabetes is well controlled. I have recommended the patient discontinue metformin and try to maintain his blood sugars with diet and lifestyle changes alone. Recheck hemoglobin A1c in 3-6 months. At that time I would repeat a urine microalbumin.  2. Paroxysmal atrial fibrillation We had a discussion regarding eliquis.  Patient is more confident continuing aspirin 325 mg daily at the present time. His heart rate is well controlled.  3. Essential hypertension Pressures well controlled. Continue Toprol-XL mainly for rate control.  4. HLD (hyperlipidemia) Fasting lipid panel is adequate. I will make that change in his simvastatin at the present time.

## 2014-01-01 ENCOUNTER — Other Ambulatory Visit: Payer: Self-pay | Admitting: Family Medicine

## 2014-01-02 ENCOUNTER — Encounter: Payer: Self-pay | Admitting: Gastroenterology

## 2014-02-19 ENCOUNTER — Other Ambulatory Visit: Payer: Self-pay | Admitting: Family Medicine

## 2014-02-19 NOTE — Telephone Encounter (Signed)
Ok to refill 

## 2014-02-19 NOTE — Telephone Encounter (Signed)
Ok to refill??  Last office visit 12/30/2013.  Last refill 12/25/2013.

## 2014-02-19 NOTE — Telephone Encounter (Signed)
Medication called to pharmacy. 

## 2014-02-19 NOTE — Telephone Encounter (Signed)
ok 

## 2014-03-30 ENCOUNTER — Other Ambulatory Visit: Payer: BC Managed Care – PPO

## 2014-03-30 DIAGNOSIS — Z79899 Other long term (current) drug therapy: Secondary | ICD-10-CM

## 2014-03-30 DIAGNOSIS — I517 Cardiomegaly: Secondary | ICD-10-CM

## 2014-03-30 DIAGNOSIS — E785 Hyperlipidemia, unspecified: Secondary | ICD-10-CM

## 2014-03-30 DIAGNOSIS — I4891 Unspecified atrial fibrillation: Secondary | ICD-10-CM

## 2014-03-30 LAB — LIPID PANEL
CHOL/HDL RATIO: 4 ratio
CHOLESTEROL: 116 mg/dL (ref 0–200)
HDL: 29 mg/dL — ABNORMAL LOW (ref >39)
LDL Cholesterol: 67 mg/dL (ref 0–99)
TRIGLYCERIDES: 99 mg/dL (ref <150)
VLDL: 20 mg/dL (ref 0–40)

## 2014-03-30 LAB — CBC WITH DIFFERENTIAL/PLATELET
BASOS ABS: 0.1 10*3/uL (ref 0.0–0.1)
BASOS PCT: 1 % (ref 0–1)
EOS PCT: 3 % (ref 0–5)
Eosinophils Absolute: 0.3 10*3/uL (ref 0.0–0.7)
HEMATOCRIT: 44.3 % (ref 39.0–52.0)
Hemoglobin: 15.2 g/dL (ref 13.0–17.0)
LYMPHS PCT: 29 % (ref 12–46)
Lymphs Abs: 2.5 10*3/uL (ref 0.7–4.0)
MCH: 28.8 pg (ref 26.0–34.0)
MCHC: 34.3 g/dL (ref 30.0–36.0)
MCV: 84.1 fL (ref 78.0–100.0)
MONO ABS: 0.5 10*3/uL (ref 0.1–1.0)
MONOS PCT: 6 % (ref 3–12)
Neutro Abs: 5.2 10*3/uL (ref 1.7–7.7)
Neutrophils Relative %: 61 % (ref 43–77)
Platelets: 243 10*3/uL (ref 150–400)
RBC: 5.27 MIL/uL (ref 4.22–5.81)
RDW: 14.8 % (ref 11.5–15.5)
WBC: 8.6 10*3/uL (ref 4.0–10.5)

## 2014-03-30 LAB — HEMOGLOBIN A1C
Hgb A1c MFr Bld: 5.8 % — ABNORMAL HIGH (ref <5.7)
Mean Plasma Glucose: 120 mg/dL — ABNORMAL HIGH (ref <117)

## 2014-03-30 LAB — COMPLETE METABOLIC PANEL WITH GFR
ALT: 19 U/L (ref 0–53)
AST: 17 U/L (ref 0–37)
Albumin: 3.8 g/dL (ref 3.5–5.2)
Alkaline Phosphatase: 90 U/L (ref 39–117)
BUN: 13 mg/dL (ref 6–23)
CHLORIDE: 105 meq/L (ref 96–112)
CO2: 25 mEq/L (ref 19–32)
CREATININE: 0.8 mg/dL (ref 0.50–1.35)
Calcium: 8.9 mg/dL (ref 8.4–10.5)
GFR, Est African American: >89 mL/min
GFR, Est Non African American: >89 mL/min
GLUCOSE: 91 mg/dL (ref 70–99)
Potassium: 4.4 mEq/L (ref 3.5–5.3)
Sodium: 139 mEq/L (ref 135–145)
Total Bilirubin: 0.9 mg/dL (ref 0.2–1.2)
Total Protein: 6.1 g/dL (ref 6.0–8.3)

## 2014-04-02 ENCOUNTER — Telehealth: Payer: Self-pay | Admitting: *Deleted

## 2014-04-02 ENCOUNTER — Encounter: Payer: Self-pay | Admitting: Family Medicine

## 2014-04-02 ENCOUNTER — Ambulatory Visit (INDEPENDENT_AMBULATORY_CARE_PROVIDER_SITE_OTHER): Payer: BC Managed Care – PPO | Admitting: Family Medicine

## 2014-04-02 VITALS — BP 122/70 | HR 76 | Temp 97.6°F | Resp 18 | Ht 68.0 in | Wt 210.0 lb

## 2014-04-02 DIAGNOSIS — Z23 Encounter for immunization: Secondary | ICD-10-CM

## 2014-04-02 DIAGNOSIS — I10 Essential (primary) hypertension: Secondary | ICD-10-CM

## 2014-04-02 DIAGNOSIS — L858 Other specified epidermal thickening: Secondary | ICD-10-CM

## 2014-04-02 DIAGNOSIS — I480 Paroxysmal atrial fibrillation: Secondary | ICD-10-CM

## 2014-04-02 DIAGNOSIS — E785 Hyperlipidemia, unspecified: Secondary | ICD-10-CM

## 2014-04-02 NOTE — Progress Notes (Signed)
Subjective:    Patient ID: Steven Klein, male    DOB: 01-18-1961, 53 y.o.   MRN: [id number]  HPI Patient has been off his metformin now for over 3 months. He continues to work on diet exercise and weight loss. Fortunately his hemoglobin A1c has remained excellent at 5.8. He denies any polyuria, polydipsia, or blurred vision. He denies any chest pain shortness of breath or dyspnea on exertion. His blood pressure is well-controlled today at 122/70. His heart rate is controlled at 76 beats per minute on metoprolol. The patient does have atrial fibrillation but refuses to take anti-coagulation. He agrees to take aspirin 325 mg by mouth daily. His CHADS2 score is 2 which makes Coumadin a more appropriate anti-coagulation. He understands this and accepts the risk. His cholesterol is well controlled with an LDL cholesterol less than 70 on Zocor 40 mg by mouth daily. He denies any myalgias or right upper quadrant pain. Past Medical History  Diagnosis Date  . Atrial fibrillation   . Cardiomegaly   . Metabolic syndrome X   . Hyperlipidemia   . Panic attack   . Anxiety and depression   . Depression   . Diabetes mellitus without complication    Past Surgical History  Procedure Laterality Date  . Orif ankle fracture     Current Outpatient Prescriptions on File Prior to Visit  Medication Sig Dispense Refill  . ALPRAZolam (XANAX) 0.5 MG tablet TAKE 1 TABLET BY MOUTH EVERY 8 HOURS AS NEEDED FOR ANXIETY  60 tablet  0  . aspirin 325 MG tablet Take 325 mg by mouth daily.        Marland Kitchen docusate sodium (STOOL SOFTENER) 100 MG capsule Take one tablet by mouth in the morning and two tablets by mouth at night.       . metoprolol succinate (TOPROL-XL) 50 MG 24 hr tablet Take 50 mg by mouth daily. 1/2 tab po qd      . PARoxetine (PAXIL-CR) 25 MG 24 hr tablet TAKE 1 TABLET BY MOUTH EVERY MORNING  30 tablet  11  . simvastatin (ZOCOR) 40 MG tablet TAKE 1 TABLET BY MOUTH AT BEDTIME  30 tablet  5   No current  facility-administered medications on file prior to visit.   Allergies  Allergen Reactions  . Ezetimibe-Simvastatin   . Sulfonamide Derivatives     REACTION: breathing problems   History   Social History  . Marital Status: Married    Spouse Name: N/A    Number of Children: 1  . Years of Education: N/A   Occupational History  . OWNER     [employer]   Social History Main Topics  . Smoking status: Former Smoker    Quit date: 03/15/2010  . Smokeless tobacco: Not on file  . Alcohol Use: No  . Drug Use: No  . Sexual Activity: Not on file   Other Topics Concern  . Not on file   Social History Narrative  . No narrative on file      Review of Systems  All other systems reviewed and are negative.      Objective:   Physical Exam  Vitals reviewed. Constitutional: He appears well-developed and well-nourished. No distress.  Neck: Neck supple. No thyromegaly present.  Cardiovascular: Normal rate, normal heart sounds and intact distal pulses.  An irregularly irregular rhythm present.  No murmur heard. Pulmonary/Chest: Effort normal and breath sounds normal. No respiratory distress. He has no wheezes. He has no rales. He exhibits  no tenderness.  Abdominal: Soft. Bowel sounds are normal. He exhibits no distension. There is no tenderness. There is no rebound and no guarding.  Musculoskeletal: He exhibits no edema.  Skin: He is not diaphoretic.          Assessment & Plan:  Essential hypertension  Paroxysmal atrial fibrillation  HLD (hyperlipidemia)  Need for prophylactic vaccination and inoculation against influenza - Plan: Flu Vaccine QUAD 36+ mos IM   Patient's blood pressure is excellent. I will make no changes in his medication at this time. Patient's most recent lab work is listed below: Lab on 03/30/2014  Component Date Value Ref Range Status  . Cholesterol 03/30/2014 116  0 - 200 mg/dL Final   Comment: ATP III Classification:                                <  200        mg/dL        Desirable                               200 - 239     mg/dL        Borderline High                               >= 240        mg/dL        High                             . Triglycerides 03/30/2014 99  <150 mg/dL Final  . HDL 03/30/2014 29* >39 mg/dL Final  . Total CHOL/HDL Ratio 03/30/2014 4.0   Final  . VLDL 03/30/2014 20  0 - 40 mg/dL Final  . LDL Cholesterol 03/30/2014 67  0 - 99 mg/dL Final   Comment:                            Total Cholesterol/HDL Ratio:CHD Risk                                                 Coronary Heart Disease Risk Table                                                                 Men       Women                                   1/2 Average Risk              3.4        3.3  Average Risk              5.0        4.4                                    2X Average Risk              9.6        7.1                                    3X Average Risk             23.4       11.0                          Use the calculated Patient Ratio above and the CHD Risk table                           to determine the patient's CHD Risk.                          ATP III Classification (LDL):                                < 100        mg/dL         Optimal                               100 - 129     mg/dL         Near or Above Optimal                               130 - 159     mg/dL         Borderline High                               160 - 189     mg/dL         High                                > 190        mg/dL         Very High                             . Hemoglobin A1C 03/30/2014 5.8* <5.7 % Final   Comment:  According to the ADA Clinical Practice Recommendations for 2011, when                          HbA1c is used as a screening test:                                                       >=6.5%   Diagnostic  of Diabetes Mellitus                                     (if abnormal result is confirmed)                                                     5.7-6.4%   Increased risk of developing Diabetes Mellitus                                                     References:Diagnosis and Classification of Diabetes Mellitus,Diabetes                          OVZC,5885,02(DXAJO 1):S62-S69 and Standards of Medical Care in                                  Diabetes - 2011,Diabetes INOM,7672,09 (Suppl 1):S11-S61.                             . Mean Plasma Glucose 03/30/2014 120* <117 mg/dL Final  . WBC 03/30/2014 8.6  4.0 - 10.5 K/uL Final  . RBC 03/30/2014 5.27  4.22 - 5.81 MIL/uL Final  . Hemoglobin 03/30/2014 15.2  13.0 - 17.0 g/dL Final  . HCT 03/30/2014 44.3  39.0 - 52.0 % Final  . MCV 03/30/2014 84.1  78.0 - 100.0 fL Final  . MCH 03/30/2014 28.8  26.0 - 34.0 pg Final  . MCHC 03/30/2014 34.3  30.0 - 36.0 g/dL Final  . RDW 03/30/2014 14.8  11.5 - 15.5 % Final  . Platelets 03/30/2014 243  150 - 400 K/uL Final  . Neutrophils Relative % 03/30/2014 61  43 - 77 % Final  . Neutro Abs 03/30/2014 5.2  1.7 - 7.7 K/uL Final  . Lymphocytes Relative 03/30/2014 29  12 - 46 % Final  . Lymphs Abs 03/30/2014 2.5  0.7 - 4.0 K/uL Final  . Monocytes Relative 03/30/2014 6  3 - 12 % Final  . Monocytes Absolute 03/30/2014 0.5  0.1 - 1.0 K/uL Final  . Eosinophils Relative 03/30/2014 3  0 - 5 % Final  . Eosinophils Absolute 03/30/2014 0.3  0.0 - 0.7 K/uL Final  . Basophils Relative 03/30/2014 1  0 - 1 % Final  . Basophils Absolute 03/30/2014 0.1  0.0 - 0.1 K/uL Final  . Smear Review 03/30/2014 Criteria for  review not met   Final  . Sodium 03/30/2014 139  135 - 145 mEq/L Final  . Potassium 03/30/2014 4.4  3.5 - 5.3 mEq/L Final  . Chloride 03/30/2014 105  96 - 112 mEq/L Final  . CO2 03/30/2014 25  19 - 32 mEq/L Final  . Glucose, Bld 03/30/2014 91  70 - 99 mg/dL Final  . BUN 03/30/2014 13  6 - 23 mg/dL Final  . Creat  03/30/2014 0.80  0.50 - 1.35 mg/dL Final  . Total Bilirubin 03/30/2014 0.9  0.2 - 1.2 mg/dL Final  . Alkaline Phosphatase 03/30/2014 90  39 - 117 U/L Final  . AST 03/30/2014 17  0 - 37 U/L Final  . ALT 03/30/2014 19  0 - 53 U/L Final  . Total Protein 03/30/2014 6.1  6.0 - 8.3 g/dL Final  . Albumin 03/30/2014 3.8  3.5 - 5.2 g/dL Final  . Calcium 03/30/2014 8.9  8.4 - 10.5 mg/dL Final  . GFR, Est African American 03/30/2014 >89   Final  . GFR, Est Non African American 03/30/2014 >89   Final   Comment:                            The estimated GFR is a calculation valid for adults (>=48 years old)                          that uses the CKD-EPI algorithm to adjust for age and sex. It is                            not to be used for children, pregnant women, hospitalized patients,                             patients on dialysis, or with rapidly changing kidney function.                          According to the NKDEP, eGFR >89 is normal, 60-89 shows mild                          impairment, 30-59 shows moderate impairment, 15-29 shows severe                          impairment and <15 is ESRD.                              His cholesterol is excellent. We discussed increasing aerobic exercise to try to raise his HDL cholesterol. His heart rate is appropriately controlled using the Toprol. I recommended he take aspirin 325 mg by mouth daily at least for stroke prevention. Patient also received his flu shot today in clinic. Recheck in 6 months.

## 2014-04-02 NOTE — Telephone Encounter (Signed)
Pt has appointment with [facility] on Nov 13 at 9:30am, lmtrc to pt

## 2014-04-02 NOTE — Addendum Note (Signed)
Addended by: Jenna Luo on: 04/02/2014 11:02 AM   Modules accepted: Orders

## 2014-04-02 NOTE — Progress Notes (Signed)
   Subjective:    Patient ID: Steven Klein, male    DOB: 1960-08-15, 53 y.o.   MRN: [id number]  HPI Patient also has a lesion on his left lateral bicep. Is approximately 2 cm in diameter. It is an erythematous papule with a central keratotic horn.     Review of Systems     Objective:   Physical Exam        Assessment & Plan:  This area requires excisional biopsy. 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 Plan refer the patient to dermatology as I am unable to operate at the present time due to a hand injury.

## 2014-04-03 NOTE — Telephone Encounter (Signed)
Pt wife called is aware of appt scheduled for nov 13

## 2014-04-20 ENCOUNTER — Other Ambulatory Visit: Payer: Self-pay | Admitting: Family Medicine

## 2014-04-20 NOTE — Telephone Encounter (Signed)
Ok to refill??  Last office visit 04/02/2014.  Last refill 02/19/2014.

## 2014-05-01 ENCOUNTER — Other Ambulatory Visit: Payer: Self-pay

## 2014-06-08 ENCOUNTER — Other Ambulatory Visit: Payer: Self-pay | Admitting: Family Medicine

## 2014-06-08 NOTE — Telephone Encounter (Signed)
ok 

## 2014-06-08 NOTE — Telephone Encounter (Signed)
Medication called to pharmacy. 

## 2014-06-08 NOTE — Telephone Encounter (Signed)
Ok to refill??  Last office visit 04/02/2014.  Last refill 04/20/2014.

## 2014-08-20 ENCOUNTER — Other Ambulatory Visit: Payer: Self-pay | Admitting: Family Medicine

## 2014-08-20 NOTE — Telephone Encounter (Signed)
Medication called to pharmacy. 

## 2014-08-20 NOTE — Telephone Encounter (Signed)
ok 

## 2014-08-20 NOTE — Telephone Encounter (Signed)
Ok to refill??  Last office visit 04/02/2014.  Last refill 06/08/2014.

## 2014-09-14 ENCOUNTER — Encounter: Payer: Self-pay | Admitting: Gastroenterology

## 2014-09-15 ENCOUNTER — Encounter: Payer: Self-pay | Admitting: Gastroenterology

## 2014-10-02 ENCOUNTER — Ambulatory Visit: Payer: BC Managed Care – PPO | Admitting: Family Medicine

## 2014-10-05 ENCOUNTER — Ambulatory Visit (INDEPENDENT_AMBULATORY_CARE_PROVIDER_SITE_OTHER): Payer: BLUE CROSS/BLUE SHIELD | Admitting: Family Medicine

## 2014-10-05 ENCOUNTER — Encounter: Payer: Self-pay | Admitting: Family Medicine

## 2014-10-05 VITALS — BP 110/78 | HR 74 | Temp 97.8°F | Resp 16 | Ht 68.0 in | Wt 220.0 lb

## 2014-10-05 DIAGNOSIS — E119 Type 2 diabetes mellitus without complications: Secondary | ICD-10-CM | POA: Diagnosis not present

## 2014-10-05 DIAGNOSIS — I10 Essential (primary) hypertension: Secondary | ICD-10-CM | POA: Diagnosis not present

## 2014-10-05 DIAGNOSIS — Z125 Encounter for screening for malignant neoplasm of prostate: Secondary | ICD-10-CM | POA: Diagnosis not present

## 2014-10-05 DIAGNOSIS — E785 Hyperlipidemia, unspecified: Secondary | ICD-10-CM

## 2014-10-05 LAB — HEMOGLOBIN A1C
Hgb A1c MFr Bld: 6.1 % — ABNORMAL HIGH (ref <5.7)
Mean Plasma Glucose: 128 mg/dL — ABNORMAL HIGH (ref <117)

## 2014-10-05 LAB — COMPLETE METABOLIC PANEL WITH GFR
ALBUMIN: 3.9 g/dL (ref 3.5–5.2)
ALT: 15 U/L (ref 0–53)
AST: 16 U/L (ref 0–37)
Alkaline Phosphatase: 95 U/L (ref 39–117)
BILIRUBIN TOTAL: 1.6 mg/dL — AB (ref 0.2–1.2)
BUN: 11 mg/dL (ref 6–23)
CALCIUM: 8.9 mg/dL (ref 8.4–10.5)
CO2: 27 meq/L (ref 19–32)
Chloride: 104 mEq/L (ref 96–112)
Creat: 0.75 mg/dL (ref 0.50–1.35)
GFR, Est African American: >89 mL/min
GFR, Est Non African American: >89 mL/min
GLUCOSE: 98 mg/dL (ref 70–99)
Potassium: 4.2 mEq/L (ref 3.5–5.3)
Sodium: 138 mEq/L (ref 135–145)
Total Protein: 6.3 g/dL (ref 6.0–8.3)

## 2014-10-05 LAB — LIPID PANEL
Cholesterol: 120 mg/dL (ref 0–200)
HDL: 34 mg/dL — ABNORMAL LOW (ref >=40)
LDL Cholesterol: 66 mg/dL (ref 0–99)
TRIGLYCERIDES: 102 mg/dL (ref <150)
Total CHOL/HDL Ratio: 3.5 Ratio
VLDL: 20 mg/dL (ref 0–40)

## 2014-10-05 NOTE — Progress Notes (Signed)
Subjective:    Patient ID: Steven Klein, male    DOB: [birth year], 54 y.o.   MRN: [id number]  HPI  Patient has a history of paroxysmal atrial fibrillation, metabolic syndrome, and diabetes. His diabetes is diet controlled after he lost over 40 pounds. He is here today to recheck a hemoglobin A1c. He is not checking his blood sugar but he denies any polyuria, polydipsia, or blurred vision. His blood pressures well controlled today at 110/78. He denies any myalgias or right upper quadrant pain on simvastatin.  Today the patient is in normal sinus rhythm. He is taking aspirin 325 mg by mouth daily for stroke prevention. Diabetic foot exam was performed today and is normal. He denies any chest pain shortness of breath dyspnea on exertion palpitations or presyncope. He is also due for PSA. Past Medical History  Diagnosis Date  . Atrial fibrillation   . Cardiomegaly   . Metabolic syndrome X   . Hyperlipidemia   . Panic attack   . Anxiety and depression   . Depression   . Diabetes mellitus without complication    Past Surgical History  Procedure Laterality Date  . Orif ankle fracture     Current Outpatient Prescriptions on File Prior to Visit  Medication Sig Dispense Refill  . ALPRAZolam (XANAX) 0.5 MG tablet TAKE 1 TABLET BY MOUTH EVERY 8 HOURS AS NEEDED FOR ANXIETY 60 tablet 0  . aspirin 325 MG tablet Take 325 mg by mouth daily.      Marland Kitchen docusate sodium (STOOL SOFTENER) 100 MG capsule Take one tablet by mouth in the morning and two tablets by mouth at night.     . metoprolol succinate (TOPROL-XL) 50 MG 24 hr tablet Take 50 mg by mouth daily. 1/2 tab po qd    . PARoxetine (PAXIL-CR) 25 MG 24 hr tablet TAKE 1 TABLET BY MOUTH EVERY MORNING 30 tablet 11  . simvastatin (ZOCOR) 40 MG tablet TAKE 1 TABLET BY MOUTH AT BEDTIME 30 tablet 5   No current facility-administered medications on file prior to visit.   Allergies  Allergen Reactions  . Ezetimibe-Simvastatin   . Sulfonamide Derivatives      REACTION: breathing problems   History   Social History  . Marital Status: Married    Spouse Name: N/A  . Number of Children: 1  . Years of Education: N/A   Occupational History  . OWNER     [employer]   Social History Main Topics  . Smoking status: Former Smoker    Quit date: 03/15/2010  . Smokeless tobacco: Not on file  . Alcohol Use: No  . Drug Use: No  . Sexual Activity: Not on file   Other Topics Concern  . Not on file   Social History Narrative     Review of Systems  All other systems reviewed and are negative.      Objective:   Physical Exam  Constitutional: He is oriented to person, place, and time. He appears well-developed and well-nourished. No distress.  HENT:  Right Ear: External ear normal.  Left Ear: External ear normal.  Nose: Nose normal.  Mouth/Throat: Oropharynx is clear and moist. No oropharyngeal exudate.  Eyes: Conjunctivae are normal.  Neck: Neck supple. No thyromegaly present.  Cardiovascular: Normal rate, regular rhythm, normal heart sounds and intact distal pulses.  Exam reveals no gallop and no friction rub.   No murmur heard. Pulmonary/Chest: Effort normal and breath sounds normal. No respiratory distress. He has no wheezes. He  has no rales.  Abdominal: Soft. Bowel sounds are normal. He exhibits no distension and no mass. There is no tenderness. There is no rebound and no guarding.  Musculoskeletal: He exhibits no edema.  Lymphadenopathy:    He has no cervical adenopathy.  Neurological: He is alert and oriented to person, place, and time. He has normal reflexes. He displays normal reflexes. No cranial nerve deficit. He exhibits normal muscle tone. Coordination normal.  Skin: He is not diaphoretic.  Vitals reviewed.         Assessment & Plan:  Diabetes mellitus type II, controlled - Plan: COMPLETE METABOLIC PANEL WITH GFR, Lipid panel, Hemoglobin A1c, Microalbumin, urine  Prostate cancer screening - Plan: PSA  HLD  (hyperlipidemia)  Essential hypertension    patient's blood pressures well controlled. His heart rate is well controlled. He is appropriately anticoagulated. I will check a hemoglobin A1c along with fasting lipid panel. Goal hemoglobin A1c is less than 6.5. Diabetic foot exam was performed today and is normal. Goal LDL cholesterol is less than 100. Goal HDL cholesterol is greater than 40.  I will also check a PSA for prostate cancer screening purposes

## 2014-10-06 LAB — MICROALBUMIN, URINE: Microalb, Ur: 0.8 mg/dL (ref <2.0)

## 2014-10-06 LAB — PSA: PSA: 0.34 ng/mL (ref <=4.00)

## 2014-10-19 ENCOUNTER — Other Ambulatory Visit: Payer: Self-pay | Admitting: Family Medicine

## 2014-10-19 NOTE — Telephone Encounter (Signed)
Ok to refill??  Last office visit 10/05/2014.  Last refill 08/20/2014.

## 2014-10-19 NOTE — Telephone Encounter (Signed)
ok 

## 2014-10-20 NOTE — Telephone Encounter (Signed)
Medication called to pharmacy. 

## 2014-12-17 ENCOUNTER — Other Ambulatory Visit: Payer: Self-pay | Admitting: Family Medicine

## 2014-12-18 NOTE — Telephone Encounter (Signed)
Okay to refill? 

## 2014-12-18 NOTE — Telephone Encounter (Signed)
Last xanax 10/20/14 #60  OK refill?

## 2014-12-18 NOTE — Telephone Encounter (Signed)
Medication refilled per protocol. 

## 2015-01-16 ENCOUNTER — Other Ambulatory Visit: Payer: Self-pay | Admitting: Family Medicine

## 2015-02-25 ENCOUNTER — Other Ambulatory Visit: Payer: Self-pay | Admitting: Family Medicine

## 2015-02-26 NOTE — Telephone Encounter (Signed)
Medication refilled per protocol. 

## 2015-02-26 NOTE — Telephone Encounter (Signed)
LRF 12/18/14 #60  LOV 10/05/14.  Has 6 mth appt 10/18.  OK refill?

## 2015-02-26 NOTE — Telephone Encounter (Signed)
ok 

## 2015-03-04 ENCOUNTER — Encounter: Payer: Self-pay | Admitting: Gastroenterology

## 2015-04-06 ENCOUNTER — Encounter: Payer: Self-pay | Admitting: Family Medicine

## 2015-04-06 ENCOUNTER — Ambulatory Visit (INDEPENDENT_AMBULATORY_CARE_PROVIDER_SITE_OTHER): Payer: BLUE CROSS/BLUE SHIELD | Admitting: Family Medicine

## 2015-04-06 VITALS — BP 110/68 | HR 84 | Temp 98.4°F | Resp 18 | Ht 68.0 in | Wt 227.0 lb

## 2015-04-06 DIAGNOSIS — I10 Essential (primary) hypertension: Secondary | ICD-10-CM

## 2015-04-06 DIAGNOSIS — E119 Type 2 diabetes mellitus without complications: Secondary | ICD-10-CM | POA: Diagnosis not present

## 2015-04-06 DIAGNOSIS — E785 Hyperlipidemia, unspecified: Secondary | ICD-10-CM | POA: Diagnosis not present

## 2015-04-06 DIAGNOSIS — Z23 Encounter for immunization: Secondary | ICD-10-CM

## 2015-04-06 DIAGNOSIS — I480 Paroxysmal atrial fibrillation: Secondary | ICD-10-CM

## 2015-04-06 LAB — COMPLETE METABOLIC PANEL WITH GFR
ALT: 15 U/L (ref 9–46)
AST: 20 U/L (ref 10–35)
Albumin: 4 g/dL (ref 3.6–5.1)
Alkaline Phosphatase: 99 U/L (ref 40–115)
BUN: 10 mg/dL (ref 7–25)
CALCIUM: 9.1 mg/dL (ref 8.6–10.3)
CO2: 22 mmol/L (ref 20–31)
CREATININE: 0.93 mg/dL (ref 0.70–1.33)
Chloride: 104 mmol/L (ref 98–110)
GFR, Est Non African American: >89 mL/min (ref >=60)
Glucose, Bld: 70 mg/dL (ref 70–99)
Potassium: 3.9 mmol/L (ref 3.5–5.3)
Sodium: 138 mmol/L (ref 135–146)
Total Bilirubin: 1.2 mg/dL (ref 0.2–1.2)
Total Protein: 6.6 g/dL (ref 6.1–8.1)

## 2015-04-06 LAB — LIPID PANEL
Cholesterol: 121 mg/dL — ABNORMAL LOW (ref 125–200)
HDL: 30 mg/dL — ABNORMAL LOW (ref >=40)
LDL CALC: 60 mg/dL (ref <130)
TRIGLYCERIDES: 154 mg/dL — AB (ref <150)
Total CHOL/HDL Ratio: 4 Ratio (ref <=5.0)
VLDL: 31 mg/dL — ABNORMAL HIGH (ref <30)

## 2015-04-06 MED ORDER — APIXABAN 5 MG PO TABS: 5.0000 mg | ORAL_TABLET | Freq: Two times a day (BID) | ORAL | Status: DC

## 2015-04-06 NOTE — Addendum Note (Signed)
Addended by: Shary Decamp B on: 04/06/2015 09:00 AM   Modules accepted: Orders

## 2015-04-06 NOTE — Progress Notes (Signed)
Subjective:    Patient ID: Steven Klein, male    DOB: [birth year], 54 y.o.   MRN: [id number]  HPI  10/05/14 Patient has a history of paroxysmal atrial fibrillation, metabolic syndrome, and diabetes. His diabetes is diet controlled after he lost over 40 pounds. He is here today to recheck a hemoglobin A1c. He is not checking his blood sugar but he denies any polyuria, polydipsia, or blurred vision. His blood pressures well controlled today at 110/78. He denies any myalgias or right upper quadrant pain on simvastatin.  Today the patient is in normal sinus rhythm. He is taking aspirin 325 mg by mouth daily for stroke prevention. Diabetic foot exam was performed today and is normal. He denies any chest pain shortness of breath dyspnea on exertion palpitations or presyncope. He is also due for PSA.  AT that time, my plan was: patient's blood pressure is well controlled. His heart rate is well controlled. He is appropriately anticoagulated. I will check a hemoglobin A1c along with fasting lipid panel. Goal hemoglobin A1c is less than 6.5. Diabetic foot exam was performed today and is normal. Goal LDL cholesterol is less than 100. Goal HDL cholesterol is greater than 40.  I will also check a PSA for prostate cancer screening purposes.  04/06/15 He is here today for follow up.  He continues to take an aspirin 325 mg.  We had a discussion today regarding the risk of stroke. His chads score is 2 and therefore it would be recommended for him to be on Coumadin or some type of oral anticoagulation stronger than aspirin. Previously his concern has been cost. However I explained to the patient that I have a card that makes eliquis only $10 a month. His blood sugars have been well controlled. He denies any polyuria, polydipsia, or blurred vision. He denies any hypoglycemia. He denies any neuropathy in his feet. He is due for a diabetic eye exam but he would like to schedule this on his own. He is also due for a flu  shot. He denies any chest pain, shortness of breath, dyspnea on exertion. His blood pressure today is well controlled at 110/68. He denies any myalgias or right upper quadrant pain on his cholesterol medication.  He is due for fasting lab work  Past Medical History  Diagnosis Date  . Atrial fibrillation ([place])   . Cardiomegaly   . Metabolic syndrome X   . Hyperlipidemia   . Panic attack   . Anxiety and depression   . Depression   . Diabetes mellitus without complication [facility])    Past Surgical History  Procedure Laterality Date  . Orif ankle fracture     Current Outpatient Prescriptions on File Prior to Visit  Medication Sig Dispense Refill  . ALPRAZolam (XANAX) 0.5 MG tablet TAKE 1 TABLET EVERY 8 HOURS AS NEEDED 60 tablet 0  . aspirin 325 MG tablet Take 325 mg by mouth daily.      . cetirizine (ZYRTEC) 10 MG tablet Take 10 mg by mouth daily.    Marland Kitchen docusate sodium (STOOL SOFTENER) 100 MG capsule Take one tablet by mouth in the morning and two tablets by mouth at night.     . metoprolol succinate (TOPROL-XL) 50 MG 24 hr tablet TAKE 1 TABLET BY MOUTH ONCE A DAY 90 tablet 0  . PARoxetine (PAXIL-CR) 25 MG 24 hr tablet TAKE 1 TABLET BY MOUTH EVERY MORNING 30 tablet 11  . simvastatin (ZOCOR) 40 MG tablet TAKE 1 TABLET BY  MOUTH AT BEDTIME 30 tablet 5   No current facility-administered medications on file prior to visit.   Allergies  Allergen Reactions  . Ezetimibe-Simvastatin   . Sulfonamide Derivatives     REACTION: breathing problems   Social History   Social History  . Marital Status: Married    Spouse Name: N/A  . Number of Children: 1  . Years of Education: N/A   Occupational History  . OWNER     [employer]   Social History Main Topics  . Smoking status: Former Smoker    Quit date: 03/15/2010  . Smokeless tobacco: Not on file  . Alcohol Use: No  . Drug Use: No  . Sexual Activity: Not on file   Other Topics Concern  . Not on file   Social History Narrative      Review of Systems  All other systems reviewed and are negative.      Objective:   Physical Exam  Constitutional: He is oriented to person, place, and time. He appears well-developed and well-nourished. No distress.  HENT:  Right Ear: External ear normal.  Left Ear: External ear normal.  Nose: Nose normal.  Mouth/Throat: Oropharynx is clear and moist. No oropharyngeal exudate.  Eyes: Conjunctivae are normal.  Neck: Neck supple. No thyromegaly present.  Cardiovascular: Normal rate, regular rhythm, normal heart sounds and intact distal pulses.  Exam reveals no gallop and no friction rub.   No murmur heard. Pulmonary/Chest: Effort normal and breath sounds normal. No respiratory distress. He has no wheezes. He has no rales.  Abdominal: Soft. Bowel sounds are normal. He exhibits no distension and no mass. There is no tenderness. There is no rebound and no guarding.  Musculoskeletal: He exhibits no edema.  Lymphadenopathy:    He has no cervical adenopathy.  Neurological: He is alert and oriented to person, place, and time. He has normal reflexes. No cranial nerve deficit. He exhibits normal muscle tone. Coordination normal.  Skin: He is not diaphoretic.  Vitals reviewed.         Assessment & Plan:  Essential hypertension - Plan: CBC with Differential/Platelet  HLD (hyperlipidemia) - Plan: COMPLETE METABOLIC PANEL WITH GFR, Lipid panel  Paroxysmal atrial fibrillation ([facility])  Controlled type 2 diabetes mellitus without complication, without long-term current use of insulin (H[facility]) - Plan: Hemoglobin A1c   Discontinue aspirin and replaced with eliquis 5 mg pobid.  I believe this would reduce his risk of stroke more appropriately than aspirin alone. His blood pressures well controlled. I will make no changes at this time. I will check a fasting lipid panel. Goal LDL cholesterol is less than 100 because of his diabetes. I will check a hemoglobin A1c. Goal hemoglobin A1c is less than  6.5. I will also check a urine microalbumin. Patient received his flu shot today. I recommended annual diabetic eye exam

## 2015-04-07 LAB — CBC WITH DIFFERENTIAL/PLATELET
BASOS ABS: 0 10*3/uL (ref 0.0–0.1)
Basophils Relative: 0 % (ref 0–1)
Eosinophils Absolute: 0.3 10*3/uL (ref 0.0–0.7)
Eosinophils Relative: 3 % (ref 0–5)
HEMATOCRIT: 48.7 % (ref 39.0–52.0)
Hemoglobin: 16.5 g/dL (ref 13.0–17.0)
LYMPHS PCT: 25 % (ref 12–46)
Lymphs Abs: 2.8 10*3/uL (ref 0.7–4.0)
MCH: 30.3 pg (ref 26.0–34.0)
MCHC: 33.9 g/dL (ref 30.0–36.0)
MCV: 89.5 fL (ref 78.0–100.0)
MPV: 11.1 fL (ref 8.6–12.4)
Monocytes Absolute: 1.2 10*3/uL — ABNORMAL HIGH (ref 0.1–1.0)
Monocytes Relative: 11 % (ref 3–12)
NEUTROS PCT: 61 % (ref 43–77)
Neutro Abs: 6.8 10*3/uL (ref 1.7–7.7)
Platelets: 184 10*3/uL (ref 150–400)
RBC: 5.44 MIL/uL (ref 4.22–5.81)
RDW: 14 % (ref 11.5–15.5)
WBC: 11.1 10*3/uL — ABNORMAL HIGH (ref 4.0–10.5)

## 2015-04-07 LAB — HEMOGLOBIN A1C
Hgb A1c MFr Bld: 5.9 % — ABNORMAL HIGH (ref <5.7)
MEAN PLASMA GLUCOSE: 123 mg/dL — AB (ref <117)

## 2015-04-07 LAB — MICROALBUMIN, URINE: Microalb, Ur: 0.7 mg/dL

## 2015-04-08 ENCOUNTER — Encounter: Payer: Self-pay | Admitting: Family Medicine

## 2015-04-13 ENCOUNTER — Telehealth: Payer: Self-pay | Admitting: Family Medicine

## 2015-04-13 NOTE — Telephone Encounter (Signed)
Steven Klein patients wife is calling to have lab results clarified  Please call her at [phone]

## 2015-04-15 NOTE — Telephone Encounter (Signed)
All questions answered

## 2015-04-15 NOTE — Telephone Encounter (Signed)
LMTRC

## 2015-04-26 ENCOUNTER — Other Ambulatory Visit: Payer: Self-pay | Admitting: Family Medicine

## 2015-04-26 NOTE — Telephone Encounter (Signed)
Ok to refill??  Last office visit 04/06/2015.  Last refill 02/26/2015.

## 2015-04-26 NOTE — Telephone Encounter (Signed)
ok 

## 2015-04-26 NOTE — Telephone Encounter (Signed)
Script called in to pharmacy  

## 2015-05-05 ENCOUNTER — Ambulatory Visit (AMBULATORY_SURGERY_CENTER): Payer: Self-pay

## 2015-05-05 ENCOUNTER — Telehealth: Payer: Self-pay

## 2015-05-05 ENCOUNTER — Ambulatory Visit (INDEPENDENT_AMBULATORY_CARE_PROVIDER_SITE_OTHER): Payer: BLUE CROSS/BLUE SHIELD | Admitting: Physician Assistant

## 2015-05-05 ENCOUNTER — Encounter: Payer: Self-pay | Admitting: Physician Assistant

## 2015-05-05 VITALS — Ht 66.5 in | Wt 227.0 lb

## 2015-05-05 VITALS — BP 110/64 | HR 70 | Ht 67.0 in | Wt 299.6 lb

## 2015-05-05 DIAGNOSIS — Z7901 Long term (current) use of anticoagulants: Secondary | ICD-10-CM

## 2015-05-05 DIAGNOSIS — Z860100 Personal history of colon polyps, unspecified: Secondary | ICD-10-CM

## 2015-05-05 DIAGNOSIS — I480 Paroxysmal atrial fibrillation: Secondary | ICD-10-CM

## 2015-05-05 MED ORDER — SUPREP BOWEL PREP KIT 17.5-3.13-1.6 GM/177ML PO SOLN: 1.0000 | Freq: Once | ORAL | Status: DC

## 2015-05-05 NOTE — Progress Notes (Signed)
Reviewed and agree with management plan.  Darielys Giglia T. Gatlyn Lipari, MD FACG 

## 2015-05-05 NOTE — Progress Notes (Signed)
No allergies to eggs or soy No past problems with anesthesia No home oxygen No diet/weight loss meds  No email or internet; refused emmi

## 2015-05-05 NOTE — Progress Notes (Signed)
Patient ID: Steven Klein, male   DOB: [birth year], 54 y.o.   MRN: [id number]    HPI:  Steven Klein is a 54 y.o.   male  Who is here to discuss surveillance colonoscopy. [name] is a history of atrial fibrillation  and was previously on an aspirin a day. He states that about 3 weeks ago, his primary care physician started him on Eliquis. He was seen to be scheduled in the endoscopy Center earlier today as a direct colonoscopy but was sent over as he is now on an  Antiplatelet medication. He has a history of metabolic syndrome, hyperlipidemia, cardiomegaly, anxiety and depression, diabetes, and panic attacks.    he is known to Dr. [name] from a prior colonoscopy. He last had a colonoscopy on 03/07/2011 at which time a sessile polyp was found in the proximal transverse colon. It was 5 mm in size. 2 polyps were found in the sigmoid colon and were snared without cautery.  The transverse colon polyp was found to be a tubulovillous adenoma in the sigmoid polyp was a tubular adenoma. He was advised to have surveillance in 3 years. He denies  Any change in his bowel habits or stool caliber. He's had no bloody or tarry stools. He's had no anorexia or unexplained weight loss.   Past Medical History  Diagnosis Date  . Atrial fibrillation ([place])   . Cardiomegaly   . Metabolic syndrome X   . Hyperlipidemia   . Panic attack   . Anxiety and depression   . Depression   . Diabetes mellitus without complication [facility])     Past Surgical History  Procedure Laterality Date  . Orif ankle fracture    . Colonoscopy w/ polypectomy     Family History  Problem Relation Age of Onset  . Adopted: Yes  . Colon cancer Neg Hx   . Esophageal cancer Neg Hx   . Stomach cancer Neg Hx    Social History  Substance Use Topics  . Smoking status: Current Every Day Smoker    Last Attempt to Quit: 03/15/2010  . Smokeless tobacco: Never Used  . Alcohol Use: No   Current Outpatient Prescriptions  Medication Sig Dispense  Refill  . ALPRAZolam (XANAX) 0.5 MG tablet TAKE 1 TABLET BY MOUTH EVERY 8 HOURS AS NEEDED 60 tablet 0  . apixaban (ELIQUIS) 5 MG TABS tablet Take 1 tablet (5 mg total) by mouth 2 (two) times daily. 60 tablet 11  . cetirizine (ZYRTEC) 10 MG tablet Take 10 mg by mouth daily.    Marland Kitchen docusate sodium (STOOL SOFTENER) 100 MG capsule Take one tablet by mouth in the morning and one tablets by mouth at night.    . metoprolol succinate (TOPROL-XL) 50 MG 24 hr tablet TAKE 1 TABLET BY MOUTH ONCE A DAY (Patient taking differently: take 1/2 tab ($Remove'25mg'PJfOyAH$ ) daily) 90 tablet 0  . PARoxetine (PAXIL-CR) 25 MG 24 hr tablet TAKE 1 TABLET BY MOUTH EVERY MORNING 30 tablet 11  . simvastatin (ZOCOR) 40 MG tablet TAKE 1 TABLET BY MOUTH AT BEDTIME 30 tablet 5  . SUPREP BOWEL PREP SOLN Take 1 kit by mouth once. 354 mL 0   No current facility-administered medications for this visit.   Allergies  Allergen Reactions  . Ezetimibe-Simvastatin     Does not remember  . Sulfonamide Derivatives     REACTION: breathing problems     Review of Systems: Gen: Denies any fever, chills, sweats, anorexia, fatigue, weakness, malaise, weight loss, and sleep  disorder CV: Denies chest pain, angina, palpitations, syncope, orthopnea, PND, peripheral edema, and claudication. Resp: Denies dyspnea at rest, dyspnea with exercise, cough, sputum, wheezing, coughing up blood, and pleurisy. GI: Denies vomiting blood, jaundice, and fecal incontinence.   Denies dysphagia or odynophagia. GU : Denies urinary burning, blood in urine, urinary frequency, urinary hesitancy, nocturnal urination, and urinary incontinence. MS: Denies joint pain, limitation of movement, and swelling, stiffness, low back pain, extremity pain. Denies muscle weakness, cramps, atrophy.  Derm: Denies rash, itching, dry skin, hives, moles, warts, or unhealing ulcers.  Psych: Denies depression, anxiety, memory loss, suicidal ideation, hallucinations, paranoia, and confusion. Heme:  Denies bruising, bleeding, and enlarged lymph nodes. Neuro:  Denies any headaches, dizziness, paresthesias. Endo:  Denies any problems with DM, thyroid, adrenal function   Prior Endoscopies:   See history of present illness  Physical Exam: BP 110/64 mmHg  Pulse 70  Ht $R'5\' 7"'Lw$  (1.702 m)  Wt 299 lb 9.6 oz (135.898 kg)  BMI 46.91 kg/m2 Constitutional: Pleasant,well-developed,  Caucasianmale in no acute distress. HEENT: Normocephalic and atraumatic. Conjunctivae are normal. No scleral icterus. Neck supple.  No thyromegaly Cardiovascular: Normal rate, regular rhythm.  Pulmonary/chest: Effort normal and breath sounds normal. No wheezing, rales or rhonchi. Abdominal: Soft, nondistended, nontender. Bowel sounds active throughout. There are no masses palpable. No hepatomegaly. Extremities: no edema Lymphadenopathy: No cervical adenopathy noted. Neurological: Alert and oriented to person place and time. Skin: Skin is warm and dry. No rashes noted. Psychiatric: Normal mood and affect. Behavior is normal.  ASSESSMENT AND PLAN:  asymptomatic 54 year old male with a history of adenomatous colon polyps due for surveillance colonoscopy to evaluate for recurrent polyps or neoplasia.The risks, benefits, and alternatives to colonoscopy with possible biopsy and possible polypectomy were discussed with the patient and they consent to proceed. Hold eliquis  2 days before procedure - will instruct when and how to resume after procedure. Risks and benefits of procedure including bleeding, perforation, infection, missed lesions, medication reactions and possible hospitalization or surgery if complications occur explained. Additional rare but real risk of cardiovascular event such as heart attack or ischemia/infarct of other organs off eliquis explained and need to seek urgent help if this occurs. Will communicate by phone or EMR with patient's prescribing provider that to confirm holding eliquis is reasonable in this  case.     [name], [name] 05/05/2015, 7:54 PM  CC: Susy Frizzle, MD

## 2015-05-05 NOTE — Patient Instructions (Signed)
You have already been scheduled for a colonoscopy with Dr [name] on 05-19-2015.  Today we have given you a sample of Suprep.  We will send a letter to Dr Dennard Schaumann RE: Eliquis

## 2015-05-05 NOTE — Telephone Encounter (Signed)
  RE: Steven Klein DOB: 1961-02-12 MRN: [id number]   Dear Dr Dennard Schaumann,    We have scheduled the above patient for an endoscopic procedure (colonoscopy). Our records show that he is on anticoagulation therapy.   Please advise as to how long the patient may come off his therapy of Eliquis prior to the procedure, which is scheduled for 05-19-2015 with Dr Jerilynn Mages. [name]  Please fax back/ or route the completed form to Magdalene [place], [place] at [phone].   Sincerely,    Elias Else

## 2015-05-10 ENCOUNTER — Encounter: Payer: Self-pay | Admitting: Gastroenterology

## 2015-05-10 NOTE — Telephone Encounter (Signed)
Sorry for the late reply, I was out of the office last week on vacation. He can discontinue eliquis 48 hours prior to procedure. Thanks, Gershon Mussel

## 2015-05-11 NOTE — Telephone Encounter (Signed)
Spoke to Cameron (wife). Directions for Eliquis hold were given. She states understanding and will inform Mr Pagliarulo

## 2015-05-19 ENCOUNTER — Ambulatory Visit (AMBULATORY_SURGERY_CENTER): Payer: BLUE CROSS/BLUE SHIELD | Admitting: Gastroenterology

## 2015-05-19 ENCOUNTER — Encounter: Payer: Self-pay | Admitting: Gastroenterology

## 2015-05-19 VITALS — BP 111/67 | HR 76 | Temp 97.7°F | Resp 15 | Ht 66.0 in

## 2015-05-19 DIAGNOSIS — D122 Benign neoplasm of ascending colon: Secondary | ICD-10-CM

## 2015-05-19 DIAGNOSIS — Z86010 Personal history of colonic polyps: Secondary | ICD-10-CM

## 2015-05-19 DIAGNOSIS — D125 Benign neoplasm of sigmoid colon: Secondary | ICD-10-CM

## 2015-05-19 DIAGNOSIS — D123 Benign neoplasm of transverse colon: Secondary | ICD-10-CM

## 2015-05-19 MED ORDER — SODIUM CHLORIDE 0.9 % IV SOLN: 500.0000 mL | INTRAVENOUS | Status: DC

## 2015-05-19 NOTE — Op Note (Signed)
[place]  [address]. [place], [address]   COLONOSCOPY PROCEDURE REPORT  PATIENT: Steven Klein, Steven Klein  MR#: [id number] BIRTHDATE: 1961/04/26 , 54  yrs. old GENDER: male ENDOSCOPIST: Ladene Artist, MD, [facility] PROCEDURE DATE:  05/19/2015 PROCEDURE:   Colonoscopy, surveillance and Colonoscopy with snare polypectomy First Screening Colonoscopy - Avg.  risk and is 50 yrs.  old or older - No.  Prior Negative Screening - Now for repeat screening. N/A  History of Adenoma - Now for follow-up colonoscopy & has been > or = to 3 yrs.  Yes hx of adenoma.  Has been 3 or more years since last colonoscopy.  Polyps removed today? 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 MEDICATIONS: Monitored anesthesia care and Propofol 200 mg IV DESCRIPTION OF PROCEDURE:   After the risks benefits and alternatives of the procedure were thoroughly explained, informed consent was obtained.  The digital rectal exam revealed no abnormalities of the rectum.   The LB [id number]-[id number]  endoscope was introduced through the anus and advanced to the cecum, which was identified by both the appendix and ileocecal valve. No adverse events experienced.   The quality of the prep was excellent. (Suprep was used)  The instrument was then slowly withdrawn as the colon was fully examined. Estimated blood loss is zero unless otherwise noted in this procedure report.    COLON FINDINGS: Three sessile polyps measuring 5-7 mm in size were found in the sigmoid colon, in the transverse colon, and in the ascending colon.  Polypectomies were performed with a cold snare. The resection was complete, the polyp tissue was completely retrieved and sent to histology.   The examination was otherwise normal.  Retroflexed views revealed no abnormalities. The time to cecum = 1.4 Withdrawal time  = 10.7   The scope was withdrawn and the procedure completed. COMPLICATIONS: There were no immediate complications.  ENDOSCOPIC IMPRESSION: 1.   Three sessile polyps in the sigmoid, transverse, and ascending colon; polypectomies performed with a cold snare 2.   The examination was otherwise normal  RECOMMENDATIONS: 1.  Hold Aspirin and all other NSAIDS for 2 weeks. 2.  Await pathology results 3.  Repeat colonoscopy in 3 years if polyp(s) adenomatous; otherwise 5 years 4.  Resume Eliquis tomorrow  eSigned:  Ladene Artist, MD, [facility] 05/19/2015 11:09 AM

## 2015-05-19 NOTE — Progress Notes (Signed)
To recovery, report to Brown, RN, VSS. 

## 2015-05-19 NOTE — Patient Instructions (Signed)
YOU HAD AN ENDOSCOPIC PROCEDURE TODAY AT [facility]:   Refer to the procedure report that was given to you for any specific questions about what was found during the examination.  If the procedure report does not answer your questions, please call your gastroenterologist to clarify.  If you requested that your care partner not be given the details of your procedure findings, then the procedure report has been included in a sealed envelope for you to review at your convenience later.  YOU SHOULD EXPECT: Some feelings of bloating in the abdomen. Passage of more gas than usual.  Walking can help get rid of the air that was put into your GI tract during the procedure and reduce the bloating. If you had a lower endoscopy (such as a colonoscopy or flexible sigmoidoscopy) you may notice spotting of blood in your stool or on the toilet paper. If you underwent a bowel prep for your procedure, you may not have a normal bowel movement for a few days.  Please Note:  You might notice some irritation and congestion in your nose or some drainage.  This is from the oxygen used during your procedure.  There is no need for concern and it should clear up in a day or so.  SYMPTOMS TO REPORT IMMEDIATELY:   Following lower endoscopy (colonoscopy or flexible sigmoidoscopy):  Excessive amounts of blood in the stool  Significant tenderness or worsening of abdominal pains  Swelling of the abdomen that is new, acute  Fever of 100F or higher   For urgent or emergent issues, a gastroenterologist can be reached at any hour by calling [phone].   DIET: Your first meal following the procedure should be a small meal and then it is ok to progress to your normal diet. Heavy or fried foods are harder to digest and may make you feel nauseous or bloated.  Likewise, meals heavy in dairy and vegetables can increase bloating.  Drink plenty of fluids but you should avoid alcoholic beverages for 24  hours.  ACTIVITY:  You should plan to take it easy for the rest of today and you should NOT DRIVE or use heavy machinery until tomorrow (because of the sedation medicines used during the test).    FOLLOW UP: Our staff will call the number listed on your records the next business day following your procedure to check on you and address any questions or concerns that you may have regarding the information given to you following your procedure. If we do not reach you, we will leave a message.  However, if you are feeling well and you are not experiencing any problems, there is no need to return our call.  We will assume that you have returned to your regular daily activities without incident.  If any biopsies were taken you will be contacted by phone or by letter within the next 1-3 weeks.  Please call us at [phone] if you have not heard about the biopsies in 3 weeks.    SIGNATURES/CONFIDENTIALITY: You and/or your care partner have signed paperwork which will be entered into your electronic medical record.  These signatures attest to the fact that that the information above on your After Visit Summary has been reviewed and is understood.  Full responsibility of the confidentiality of this discharge information lies with you and/or your care-partner.  Resume Eliquis tomorrow and Hold Aspirin and all NSAIDS for 2 weeks, resume remainder of medications. Information given on polyps.

## 2015-05-19 NOTE — Progress Notes (Signed)
Called to room to assist during endoscopic procedure.  Patient ID and intended procedure confirmed with present staff. Received instructions for my participation in the procedure from the performing physician.  

## 2015-05-20 ENCOUNTER — Telehealth: Payer: Self-pay

## 2015-05-20 NOTE — Telephone Encounter (Signed)
  Follow up Call-  Call back number 05/19/2015  Post procedure Call Back phone  # [phone]  Permission to leave phone message Yes     Patient questions:  Do you have a fever, pain , or abdominal swelling? No. Pain Score  0 *  Have you tolerated food without any problems? Yes.    Have you been able to return to your normal activities? Yes.    Do you have any questions about your discharge instructions: Diet   No. Medications  No. Follow up visit  No.  Do you have questions or concerns about your Care? No.  Actions: * If pain score is 4 or above: No action needed, pain <4.

## 2015-06-01 ENCOUNTER — Encounter: Payer: Self-pay | Admitting: Gastroenterology

## 2015-06-11 ENCOUNTER — Other Ambulatory Visit: Payer: Self-pay | Admitting: Family Medicine

## 2015-06-11 NOTE — Telephone Encounter (Signed)
Medication refilled per protocol. 

## 2015-06-14 ENCOUNTER — Other Ambulatory Visit: Payer: Self-pay | Admitting: Family Medicine

## 2015-06-15 NOTE — Telephone Encounter (Signed)
ok 

## 2015-06-15 NOTE — Telephone Encounter (Signed)
Ok to refill??  Last office visit 04/06/2015.  Last refill 04/26/2015.

## 2015-06-15 NOTE — Telephone Encounter (Signed)
Medication called to pharmacy. 

## 2015-08-23 ENCOUNTER — Other Ambulatory Visit: Payer: Self-pay | Admitting: Family Medicine

## 2015-08-23 NOTE — Telephone Encounter (Signed)
?   OK to Refill  

## 2015-08-23 NOTE — Telephone Encounter (Signed)
ok 

## 2015-09-14 ENCOUNTER — Other Ambulatory Visit: Payer: Self-pay | Admitting: Family Medicine

## 2015-10-12 ENCOUNTER — Encounter: Payer: Self-pay | Admitting: Family Medicine

## 2015-10-12 ENCOUNTER — Ambulatory Visit (INDEPENDENT_AMBULATORY_CARE_PROVIDER_SITE_OTHER): Payer: BLUE CROSS/BLUE SHIELD | Admitting: Family Medicine

## 2015-10-12 VITALS — BP 124/82 | HR 76 | Temp 98.2°F | Resp 18 | Ht 66.0 in | Wt 237.0 lb

## 2015-10-12 DIAGNOSIS — I480 Paroxysmal atrial fibrillation: Secondary | ICD-10-CM | POA: Diagnosis not present

## 2015-10-12 DIAGNOSIS — E119 Type 2 diabetes mellitus without complications: Secondary | ICD-10-CM | POA: Diagnosis not present

## 2015-10-12 DIAGNOSIS — I10 Essential (primary) hypertension: Secondary | ICD-10-CM

## 2015-10-12 DIAGNOSIS — E785 Hyperlipidemia, unspecified: Secondary | ICD-10-CM

## 2015-10-12 NOTE — Progress Notes (Signed)
Subjective:    Patient ID: Steven Klein, male    DOB: 1961/04/09, 55 y.o.   MRN: [id number]  HPI  10/05/14 Patient has a history of paroxysmal atrial fibrillation, metabolic syndrome, and diabetes. His diabetes is diet controlled after he lost over 40 pounds. He is here today to recheck a hemoglobin A1c. He is not checking his blood sugar but he denies any polyuria, polydipsia, or blurred vision. His blood pressures well controlled today at 110/78. He denies any myalgias or right upper quadrant pain on simvastatin.  Today the patient is in normal sinus rhythm. He is taking aspirin 325 mg by mouth daily for stroke prevention. Diabetic foot exam was performed today and is normal. He denies any chest pain shortness of breath dyspnea on exertion palpitations or presyncope. He is also due for PSA.  AT that time, my plan was: patient's blood pressure is well controlled. His heart rate is well controlled. He is appropriately anticoagulated. I will check a hemoglobin A1c along with fasting lipid panel. Goal hemoglobin A1c is less than 6.5. Diabetic foot exam was performed today and is normal. Goal LDL cholesterol is less than 100. Goal HDL cholesterol is greater than 40.  I will also check a PSA for prostate cancer screening purposes.  04/06/15 He is here today for follow up.  He continues to take an aspirin 325 mg.  We had a discussion today regarding the risk of stroke. His chads score is 2 and therefore it would be recommended for him to be on Coumadin or some type of oral anticoagulation stronger than aspirin. Previously his concern has been cost. However I explained to the patient that I have a card that makes eliquis only $10 a month. His blood sugars have been well controlled. He denies any polyuria, polydipsia, or blurred vision. He denies any hypoglycemia. He denies any neuropathy in his feet. He is due for a diabetic eye exam but he would like to schedule this on his own. He is also due for a flu  shot. He denies any chest pain, shortness of breath, dyspnea on exertion. His blood pressure today is well controlled at 110/68. He denies any myalgias or right upper quadrant pain on his cholesterol medication.  He is due for fasting lab work.  At that time, my plan was:  Discontinue aspirin and replaced with eliquis 5 mg pobid.  I believe this would reduce his risk of stroke more appropriately than aspirin alone. His blood pressures well controlled. I will make no changes at this time. I will check a fasting lipid panel. Goal LDL cholesterol is less than 100 because of his diabetes. I will check a hemoglobin A1c. Goal hemoglobin A1c is less than 6.5. I will also check a urine microalbumin. Patient received his flu shot today. I recommended annual diabetic eye exam  10/12/15 He is here today for follow-up. He is still not had a diabetic eye exam. He denies any polyuria polydipsia or blurred vision. He denies any chest pain or shortness of breath or dyspnea on exertion. He denies any myalgias or right upper quadrant pain. He is tolerating the eliquis without difficulty. Today on examination he is in normal sinus rhythm. He denies any syncope or presyncope or palpitations. Overall he is doing well with no concerns. He is not checking his blood sugar. He denies any symptoms of hypoglycemia. He is overdue for hemoglobin A1c  Past Medical History  Diagnosis Date  . Atrial fibrillation ([place])   .  Cardiomegaly   . Metabolic syndrome X   . Hyperlipidemia   . Panic attack   . Anxiety and depression   . Depression   . Diabetes mellitus without complication [facility])    Past Surgical History  Procedure Laterality Date  . Orif ankle fracture    . Colonoscopy w/ polypectomy     Current Outpatient Prescriptions on File Prior to Visit  Medication Sig Dispense Refill  . ALPRAZolam (XANAX) 0.5 MG tablet TAKE 1 TABLET BY MOUTH EVERY 8 HOURS AS NEEDED 60 tablet 2  . apixaban (ELIQUIS) 5 MG TABS tablet Take 1 tablet  (5 mg total) by mouth 2 (two) times daily. 60 tablet 11  . cetirizine (ZYRTEC) 10 MG tablet Take 10 mg by mouth daily.    Marland Kitchen docusate sodium (STOOL SOFTENER) 100 MG capsule Take one tablet by mouth in the morning and one tablets by mouth at night.    . metoprolol succinate (TOPROL-XL) 50 MG 24 hr tablet TAKE 1 TABLET BY MOUTH ONCE A DAY 90 tablet 4  . PARoxetine (PAXIL-CR) 25 MG 24 hr tablet TAKE 1 TABLET BY MOUTH EVERY MORNING 30 tablet 11  . simvastatin (ZOCOR) 40 MG tablet TAKE 1 TABLET BY MOUTH AT BEDTIME 90 tablet 1   No current facility-administered medications on file prior to visit.   Allergies  Allergen Reactions  . Ezetimibe-Simvastatin     Does not remember  . Sulfonamide Derivatives     REACTION: breathing problems   Social History   Social History  . Marital Status: Married    Spouse Name: N/A  . Number of Children: 1  . Years of Education: N/A   Occupational History  . OWNER     [employer]   Social History Main Topics  . Smoking status: Current Every Day Smoker    Last Attempt to Quit: 03/15/2010  . Smokeless tobacco: Never Used  . Alcohol Use: No  . Drug Use: No  . Sexual Activity: Not on file   Other Topics Concern  . Not on file   Social History Narrative     Review of Systems  All other systems reviewed and are negative.      Objective:   Physical Exam  Constitutional: He is oriented to person, place, and time. He appears well-developed and well-nourished. No distress.  HENT:  Right Ear: External ear normal.  Left Ear: External ear normal.  Nose: Nose normal.  Mouth/Throat: Oropharynx is clear and moist. No oropharyngeal exudate.  Eyes: Conjunctivae are normal.  Neck: Neck supple. No thyromegaly present.  Cardiovascular: Normal rate, regular rhythm, normal heart sounds and intact distal pulses.  Exam reveals no gallop and no friction rub.   No murmur heard. Pulmonary/Chest: Effort normal and breath sounds normal. No respiratory distress. He  has no wheezes. He has no rales.  Abdominal: Soft. Bowel sounds are normal. He exhibits no distension and no mass. There is no tenderness. There is no rebound and no guarding.  Musculoskeletal: He exhibits no edema.  Lymphadenopathy:    He has no cervical adenopathy.  Neurological: He is alert and oriented to person, place, and time. He has normal reflexes. No cranial nerve deficit. He exhibits normal muscle tone. Coordination normal.  Skin: He is not diaphoretic.  Vitals reviewed.         Assessment & Plan:  Paroxysmal atrial fibrillation (HCC)  Controlled type 2 diabetes mellitus without complication, without long-term current use of insulin ([place]) - Plan: CBC with Differential/Platelet, COMPLETE METABOLIC  PANEL WITH GFR, Lipid panel, Microalbumin, urine, Hemoglobin A1c  HLD (hyperlipidemia)  Essential hypertension  His blood pressure is acceptable. I will check a fasting lipid panel. Goal LDL cholesterol is less than 100 because of his diabetes. I will check a hemoglobin A1c. Goal hemoglobin A1c is less than 6.5. I will also schedule the patient to see an ophthalmologist for diabetic eye exam. I will check a urine microalbumin. Diabetic foot exam is performed today and is normal. Overall the patient is doing well. He has gained 10 pounds since I last saw him and I recommended increasing diet exercise and weight loss efforts to try to lose that 10 pounds

## 2015-10-13 ENCOUNTER — Other Ambulatory Visit: Payer: BLUE CROSS/BLUE SHIELD

## 2015-10-13 DIAGNOSIS — E119 Type 2 diabetes mellitus without complications: Secondary | ICD-10-CM

## 2015-10-13 LAB — COMPLETE METABOLIC PANEL WITH GFR
ALT: 18 U/L (ref 9–46)
AST: 16 U/L (ref 10–35)
Albumin: 3.6 g/dL (ref 3.6–5.1)
Alkaline Phosphatase: 86 U/L (ref 40–115)
BILIRUBIN TOTAL: 1.2 mg/dL (ref 0.2–1.2)
BUN: 11 mg/dL (ref 7–25)
CALCIUM: 8.8 mg/dL (ref 8.6–10.3)
CO2: 23 mmol/L (ref 20–31)
CREATININE: 0.8 mg/dL (ref 0.70–1.33)
Chloride: 104 mmol/L (ref 98–110)
GFR, Est Non African American: >89 mL/min (ref >=60)
Glucose, Bld: 105 mg/dL — ABNORMAL HIGH (ref 70–99)
Potassium: 4.3 mmol/L (ref 3.5–5.3)
Sodium: 140 mmol/L (ref 135–146)
TOTAL PROTEIN: 6.3 g/dL (ref 6.1–8.1)

## 2015-10-13 LAB — CBC WITH DIFFERENTIAL/PLATELET
BASOS PCT: 1 %
Basophils Absolute: 79 cells/uL (ref 0–200)
EOS ABS: 237 {cells}/uL (ref 15–500)
Eosinophils Relative: 3 %
HEMATOCRIT: 45.5 % (ref 38.5–50.0)
HEMOGLOBIN: 15.6 g/dL (ref 13.0–17.0)
LYMPHS ABS: 2528 {cells}/uL (ref 850–3900)
Lymphocytes Relative: 32 %
MCH: 30.2 pg (ref 27.0–33.0)
MCHC: 34.3 g/dL (ref 32.0–36.0)
MCV: 88 fL (ref 80.0–100.0)
MONO ABS: 632 {cells}/uL (ref 200–950)
MPV: 9.8 fL (ref 7.5–12.5)
Monocytes Relative: 8 %
NEUTROS ABS: 4424 {cells}/uL (ref 1500–7800)
Neutrophils Relative %: 56 %
Platelets: 205 10*3/uL (ref 140–400)
RBC: 5.17 MIL/uL (ref 4.20–5.80)
RDW: 13.9 % (ref 11.0–15.0)
WBC: 7.9 10*3/uL (ref 3.8–10.8)

## 2015-10-13 LAB — LIPID PANEL
Cholesterol: 140 mg/dL (ref 125–200)
HDL: 34 mg/dL — ABNORMAL LOW (ref >=40)
LDL CALC: 71 mg/dL (ref <130)
Total CHOL/HDL Ratio: 4.1 Ratio (ref <=5.0)
Triglycerides: 174 mg/dL — ABNORMAL HIGH (ref <150)
VLDL: 35 mg/dL — AB (ref <30)

## 2015-10-13 LAB — HEMOGLOBIN A1C
HEMOGLOBIN A1C: 6.1 % — AB (ref <5.7)
MEAN PLASMA GLUCOSE: 128 mg/dL

## 2015-10-14 LAB — MICROALBUMIN, URINE: Microalb, Ur: 0.6 mg/dL

## 2015-11-16 LAB — HM DIABETES EYE EXAM

## 2015-12-20 ENCOUNTER — Other Ambulatory Visit: Payer: Self-pay | Admitting: Family Medicine

## 2016-01-11 ENCOUNTER — Other Ambulatory Visit: Payer: Self-pay | Admitting: Family Medicine

## 2016-01-11 MED ORDER — PAROXETINE HCL ER 25 MG PO TB24: 25.0000 mg | ORAL_TABLET | Freq: Every morning | ORAL | 3 refills | Status: DC

## 2016-01-11 NOTE — Telephone Encounter (Signed)
Medication called/sent to requested pharmacy  

## 2016-02-09 ENCOUNTER — Other Ambulatory Visit: Payer: Self-pay | Admitting: Family Medicine

## 2016-02-09 MED ORDER — PAROXETINE HCL ER 25 MG PO TB24: 25.0000 mg | ORAL_TABLET | Freq: Every morning | ORAL | 3 refills | Status: DC

## 2016-03-06 ENCOUNTER — Other Ambulatory Visit: Payer: Self-pay | Admitting: Family Medicine

## 2016-04-14 ENCOUNTER — Other Ambulatory Visit: Payer: Self-pay | Admitting: Family Medicine

## 2016-04-14 DIAGNOSIS — I480 Paroxysmal atrial fibrillation: Secondary | ICD-10-CM

## 2016-05-30 ENCOUNTER — Other Ambulatory Visit: Payer: Self-pay | Admitting: Family Medicine

## 2016-09-12 ENCOUNTER — Other Ambulatory Visit: Payer: Self-pay | Admitting: Family Medicine

## 2016-09-12 NOTE — Telephone Encounter (Signed)
Ok to refill 

## 2016-09-12 NOTE — Telephone Encounter (Signed)
Medication called to pharmacy. 

## 2016-09-12 NOTE — Telephone Encounter (Signed)
ok 

## 2016-11-17 ENCOUNTER — Telehealth: Payer: Self-pay | Admitting: Family Medicine

## 2016-11-17 NOTE — Telephone Encounter (Signed)
Paxil CR is too expensive - they have gone up on the price. Can we change him to regular paxil and if so what dose as it does not come in 25mg  - 20 or 30mg !?

## 2016-11-17 NOTE — Telephone Encounter (Signed)
Switch to 30 mg of paxil.

## 2016-11-20 NOTE — Telephone Encounter (Signed)
LMTRC

## 2016-11-21 MED ORDER — PAROXETINE HCL 30 MG PO TABS: 30.0000 mg | ORAL_TABLET | Freq: Every day | ORAL | 3 refills | Status: DC

## 2016-11-21 NOTE — Telephone Encounter (Signed)
Medication called/sent to requested pharmacy and pt's wife aware 

## 2016-11-21 NOTE — Addendum Note (Signed)
Addended by: Shary Decamp B on: 11/21/2016 08:48 AM   Modules accepted: Orders

## 2016-11-27 ENCOUNTER — Other Ambulatory Visit: Payer: Self-pay | Admitting: Family Medicine

## 2016-12-06 ENCOUNTER — Other Ambulatory Visit: Payer: Self-pay | Admitting: Family Medicine

## 2017-01-01 ENCOUNTER — Encounter: Payer: Self-pay | Admitting: Family Medicine

## 2017-01-01 ENCOUNTER — Ambulatory Visit (INDEPENDENT_AMBULATORY_CARE_PROVIDER_SITE_OTHER): Payer: BLUE CROSS/BLUE SHIELD | Admitting: Family Medicine

## 2017-01-01 VITALS — BP 120/80 | HR 68 | Temp 98.5°F | Resp 18 | Ht 66.0 in | Wt 244.0 lb

## 2017-01-01 DIAGNOSIS — R55 Syncope and collapse: Secondary | ICD-10-CM

## 2017-01-01 DIAGNOSIS — I480 Paroxysmal atrial fibrillation: Secondary | ICD-10-CM

## 2017-01-01 DIAGNOSIS — R0600 Dyspnea, unspecified: Secondary | ICD-10-CM

## 2017-01-01 LAB — CBC WITH DIFFERENTIAL/PLATELET
BASOS ABS: 91 {cells}/uL (ref 0–200)
BASOS PCT: 1 %
EOS PCT: 3 %
Eosinophils Absolute: 273 cells/uL (ref 15–500)
HCT: 49 % (ref 38.5–50.0)
HEMOGLOBIN: 16.6 g/dL (ref 13.0–17.0)
LYMPHS ABS: 2730 {cells}/uL (ref 850–3900)
Lymphocytes Relative: 30 %
MCH: 30.1 pg (ref 27.0–33.0)
MCHC: 33.9 g/dL (ref 32.0–36.0)
MCV: 88.9 fL (ref 80.0–100.0)
MPV: 9.8 fL (ref 7.5–12.5)
Monocytes Absolute: 546 cells/uL (ref 200–950)
Monocytes Relative: 6 %
NEUTROS ABS: 5460 {cells}/uL (ref 1500–7800)
Neutrophils Relative %: 60 %
Platelets: 232 10*3/uL (ref 140–400)
RBC: 5.51 MIL/uL (ref 4.20–5.80)
RDW: 13.9 % (ref 11.0–15.0)
WBC: 9.1 10*3/uL (ref 3.8–10.8)

## 2017-01-01 LAB — COMPLETE METABOLIC PANEL WITH GFR
ALBUMIN: 4 g/dL (ref 3.6–5.1)
ALK PHOS: 115 U/L (ref 40–115)
ALT: 16 U/L (ref 9–46)
AST: 15 U/L (ref 10–35)
BILIRUBIN TOTAL: 0.7 mg/dL (ref 0.2–1.2)
BUN: 7 mg/dL (ref 7–25)
CO2: 28 mmol/L (ref 20–31)
Calcium: 9.1 mg/dL (ref 8.6–10.3)
Chloride: 104 mmol/L (ref 98–110)
Creat: 1 mg/dL (ref 0.70–1.33)
GFR, Est African American: >89 mL/min (ref >=60)
GFR, Est Non African American: 84 mL/min (ref >=60)
GLUCOSE: 99 mg/dL (ref 70–99)
Potassium: 4.4 mmol/L (ref 3.5–5.3)
SODIUM: 140 mmol/L (ref 135–146)
TOTAL PROTEIN: 6.5 g/dL (ref 6.1–8.1)

## 2017-01-01 NOTE — Progress Notes (Signed)
Subjective:    Patient ID: Steven Klein, male    DOB: Mar 07, 1961, 56 y.o.   MRN: [id number]  HPI Patient is a very pleasant 56 year old Caucasian male who has a history of paroxysmal atrial fibrillation. Over the last year, the patient has had 3 separate occasions where he has experienced near syncope. He states that his heart feels like it is "not beathing right".  During those episodes, he will become diaphoretic, pale, short of breath. He will feel like he is about to pass out. Patient has never actually passed out. He denies any chest pain. All 3 instances occur at the end of a hard days work in the heat. The patient is a Development worker, international aid. These events have never occurred without provocation. Each of the 3 events have occurred at the end of the hard work day where he has been extremely dehydrated. The reason he comes in today is because 2 of the events have occurred in the last month and his wife is concerned. Each of the 3 events resolve spontaneously after he got into a cool environment and had something to drink. Differential diagnosis includes cerebral hypoperfusion secondary to dehydration and low blood pressure, bradycardia, or rapid ventricular response. I am suspicious of dehydration and low blood pressure given the context of each of the 3 attacks. Past Medical History:  Diagnosis Date  . Anxiety and depression   . Atrial fibrillation ([place])   . Cardiomegaly   . Depression   . Diabetes mellitus without complication ([place])   . Hyperlipidemia   . Metabolic syndrome X   . Panic attack    Past Surgical History:  Procedure Laterality Date  . COLONOSCOPY W/ POLYPECTOMY    . ORIF ANKLE FRACTURE     Current Outpatient Prescriptions on File Prior to Visit  Medication Sig Dispense Refill  . ALPRAZolam (XANAX) 0.5 MG tablet TAKE 1 TABLET BY MOUTH EVERY 8 HOURS AS NEEDED 60 tablet 1  . cetirizine (ZYRTEC) 10 MG tablet Take 10 mg by mouth daily.    Marland Kitchen ELIQUIS 5 MG TABS tablet TAKE 1 TABLET BY  MOUTH TWICE A DAY 60 tablet 11  . metoprolol succinate (TOPROL-XL) 50 MG 24 hr tablet TAKE 1 TABLET BY MOUTH ONCE A DAY (Patient taking differently: TAKE 1/2 TABLET BY MOUTH ONCE A DAY) 90 tablet 3  . PARoxetine (PAXIL) 30 MG tablet Take 1 tablet (30 mg total) by mouth daily. 90 tablet 3  . simvastatin (ZOCOR) 40 MG tablet TAKE 1 TABLET BY MOUTH AT BEDTIME 90 tablet 1   No current facility-administered medications on file prior to visit.    Allergies  Allergen Reactions  . Ezetimibe-Simvastatin     Does not remember  . Sulfonamide Derivatives     REACTION: breathing problems   Social History   Social History  . Marital status: Married    Spouse name: N/A  . Number of children: 1  . Years of education: N/A   Occupational History  . [employer]NER [employer]   Social History Main Topics  . Smoking status: Current Every Day Smoker    Last attempt to quit: 03/15/2010  . Smokeless tobacco: Never Used  . Alcohol use No  . Drug use: No  . Sexual activity: Not on file   Other Topics Concern  . Not on file   Social History Narrative  . No narrative on file      Review of Systems  All other systems reviewed  and are negative.      Objective:   Physical Exam  Constitutional: He appears well-developed and well-nourished.  Neck: Neck supple. No JVD present.  Cardiovascular: Normal rate and normal heart sounds.  An irregularly irregular rhythm present. Exam reveals no gallop and no friction rub.   No murmur heard. Pulmonary/Chest: Effort normal and breath sounds normal. No respiratory distress. He has no wheezes. He has no rales. He exhibits no tenderness.  Abdominal: Soft. Bowel sounds are normal. He exhibits no distension. There is no tenderness. There is no rebound and no guarding.  Musculoskeletal: He exhibits no edema.  Lymphadenopathy:    He has no cervical adenopathy.  Vitals reviewed.         Assessment & Plan:  Paroxysmal atrial fibrillation  ([place]) - Plan: CBC with Differential/Platelet, COMPLETE METABOLIC PANEL WITH GFR, Ambulatory referral to Cardiology, EKG 12-Lead  Near syncope - Plan: CBC with Differential/Platelet, COMPLETE METABOLIC PANEL WITH GFR, Ambulatory referral to Cardiology, EKG 12-Lead  Dyspnea, unspecified type - Plan: CBC with Differential/Platelet, COMPLETE METABOLIC PANEL WITH GFR  All 3 of these events have occurred after a long day's work in extreme heat coupled with dehydration. Therefore I believe it's most likely secondary to low blood pressure, dehydration, heat exhaustion. However, that being said, I feel he needs a cardiology evaluation for an event monitor to rule out severe bradycardia, tachyarrhythmias such as RVR or V. tach, as well as an echocardiogram to evaluate for systolic dysfunction. In the meantime, I will obtain an EKG to rule out any evidence of ischemia. I will check a CBC to rule out anemia as well as a CMP to rule out severe dehydration/renal insufficiency. I encouraged the patient to take it easy. He needs to pace himself at work. He needs to drink plenty of water to ensure adequate hydration. He needs to take frequent breaks and rest.  EKG shows atrial fibrillation. There is an isolated Q-wave in an inferior lead but otherwise no evidence of ischemia or infarction.

## 2017-01-04 ENCOUNTER — Other Ambulatory Visit: Payer: Self-pay | Admitting: Family Medicine

## 2017-01-04 NOTE — Telephone Encounter (Signed)
ok 

## 2017-01-04 NOTE — Telephone Encounter (Signed)
Medication called to pharmacy. 

## 2017-01-04 NOTE — Telephone Encounter (Signed)
Ok to refill 

## 2017-02-04 NOTE — Progress Notes (Signed)
Cardiology Office Note   Date:  02/06/2017   ID:  Steven Klein, DOB May 25, 1961, MRN [id number]  PCP:  Susy Frizzle, MD  Cardiologist:   Jenkins Rouge, MD   No chief complaint on file.     History of Present Illness: Steven Klein is a 56 y.o. male who presents for consultation regarding PAF and near syncope Referred by Dr Dennard Schaumann.    Over the last year, the patient has had 3 separate occasions where he has experienced near syncope. He states that his heart feels like it is "not beathing right".  During those episodes, he will become diaphoretic, pale, short of breath. He will feel like he is about to pass out. Patient has never actually passed out. He denies any chest pain. All 3 instances occur at the end of a hard days work in the heat. The patient is a Development worker, international aid. These events have never occurred without provocation. Each of the 3 events have occurred at the end of the hard work day where he has been extremely dehydrated. The reason he comes in today is because 2 of the events have occurred in the last month and his wife is concerned  Was last seen by Dr Verl Blalock in 2011 and on ASA at that time Echo with normal EF mild LVH and LA 39 mm  Past Medical History:  Diagnosis Date  . Anxiety and depression   . Atrial fibrillation ([place])   . Cardiomegaly   . Depression   . Diabetes mellitus without complication ([place])   . Hyperlipidemia   . Metabolic syndrome X   . Panic attack     Past Surgical History:  Procedure Laterality Date  . COLONOSCOPY W/ POLYPECTOMY    . ORIF ANKLE FRACTURE       Current Outpatient Prescriptions  Medication Sig Dispense Refill  . ALPRAZolam (XANAX) 0.5 MG tablet TAKE 1 TABLET BY MOUTH EVERY 8 HOURS AS NEEDED 60 tablet 1  . cetirizine (ZYRTEC) 10 MG tablet Take 10 mg by mouth daily.    Marland Kitchen ELIQUIS 5 MG TABS tablet TAKE 1 TABLET BY MOUTH TWICE A DAY 60 tablet 11  . metoprolol succinate (TOPROL-XL) 50 MG 24 hr tablet Take 25 mg by mouth daily. Take  with or immediately following a meal.    . PARoxetine (PAXIL) 30 MG tablet Take 1 tablet (30 mg total) by mouth daily. 90 tablet 3  . simvastatin (ZOCOR) 40 MG tablet TAKE 1 TABLET BY MOUTH AT BEDTIME 90 tablet 1   No current facility-administered medications for this visit.     Allergies:   Ezetimibe-simvastatin and Sulfonamide derivatives    Social History:  The patient  reports that he has been smoking.  He has never used smokeless tobacco. He reports that he does not drink alcohol or use drugs.   Family History:  The patient's family history is not on file. He was adopted.    ROS:  Please see the history of present illness.   Otherwise, review of systems are positive for none.   All other systems are reviewed and negative.    PHYSICAL EXAM: VS:  BP 122/88   Pulse 68   Ht 5\' 6"  (1.676 m)   Wt 234 lb 8 oz (106.4 kg)   SpO2 97%   BMI 37.85 kg/m  , BMI Body mass index is 37.85 kg/m. Affect appropriate Healthy:  appears stated age 19: normal Neck supple with no adenopathy JVP normal no bruits no thyromegaly  Lungs clear with exp wheezing and good diaphragmatic motion Heart:  S1/S2 no murmur, no rub, gallop or click PMI normal Abdomen: benighn, BS positve, no tenderness, no AAA no bruit.  No HSM or HJR Distal pulses intact with no bruits No edema Neuro non-focal Skin warm and dry Brace left ankle with fusion     EKG: 01/01/17  afib rate 70 otherwise normal    Recent Labs: 01/01/2017: ALT 16; BUN 7; Creat 1.00; Hemoglobin 16.6; Platelets 232; Potassium 4.4; Sodium 140    Lipid Panel    Component Value Date/Time   CHOL 140 10/13/2015 0853   TRIG 174 (H) 10/13/2015 0853   HDL 34 (L) 10/13/2015 0853   CHOLHDL 4.1 10/13/2015 0853   VLDL 35 (H) 10/13/2015 0853   LDLCALC 71 10/13/2015 0853      Wt Readings from Last 3 Encounters:  02/06/17 234 lb 8 oz (106.4 kg)  01/01/17 244 lb (110.7 kg)  10/12/15 237 lb (107.5 kg)      Other studies  Reviewed: Additional studies/ records that were reviewed today include: Cardiology notes and echo Dr Verl Blalock 2011 Primary care notes Dr Dennard Schaumann .    ASSESSMENT AND PLAN:  1.  Atrial fibrillation:  Good rate control and anticoagulation no evidence of long pauses 2. Near Sycnope seems related to heat and dehydration check echo for EF 3. Anticoagulation continue eliquis labs with primary  4. Cholesterol:  Continue statin  5. Anxiety/Depression has had issues with mothers death and fighting over house with family 85. Smoking mild exp wheezing on exam f/u primary for PFT;s will order CXR today counseled For less than 10 minutes on cessation 7. CAD: smoking age and cholesterol with syncope r/o CAD unable to walk due to previous left Ankle fusion and brace Lexiscan   Current medicines are reviewed at length with the patient today.  The patient does not have concerns regarding medicines.  The following changes have been made:  no change  Labs/ tests ordered today include: CXR , Echo Lexiscan Myovue  No orders of the defined types were placed in this encounter.    Disposition:   FU with me in 3 months refer to EP to consider ILR      Signed, Jenkins Rouge, MD  02/06/2017 8:26 AM    [facility] [place], [place], [place]  [address] Phone: [phone]; Fax: [phone]

## 2017-02-06 ENCOUNTER — Ambulatory Visit
Admission: RE | Admit: 2017-02-06 | Discharge: 2017-02-06 | Disposition: A | Discharge status: Home / Self Care | Payer: BLUE CROSS/BLUE SHIELD | Source: Ambulatory Visit | Attending: Cardiovascular Disease | Admitting: Cardiovascular Disease

## 2017-02-06 ENCOUNTER — Telehealth: Payer: Self-pay | Admitting: Cardiovascular Disease

## 2017-02-06 ENCOUNTER — Encounter: Payer: Self-pay | Admitting: Cardiovascular Disease

## 2017-02-06 ENCOUNTER — Encounter (INDEPENDENT_AMBULATORY_CARE_PROVIDER_SITE_OTHER): Payer: Self-pay

## 2017-02-06 ENCOUNTER — Ambulatory Visit (INDEPENDENT_AMBULATORY_CARE_PROVIDER_SITE_OTHER): Payer: BLUE CROSS/BLUE SHIELD | Admitting: Cardiovascular Disease

## 2017-02-06 VITALS — BP 122/88 | HR 68 | Ht 66.0 in | Wt 234.5 lb

## 2017-02-06 DIAGNOSIS — R55 Syncope and collapse: Secondary | ICD-10-CM | POA: Diagnosis not present

## 2017-02-06 DIAGNOSIS — I4891 Unspecified atrial fibrillation: Secondary | ICD-10-CM

## 2017-02-06 DIAGNOSIS — F17200 Nicotine dependence, unspecified, uncomplicated: Secondary | ICD-10-CM | POA: Diagnosis not present

## 2017-02-06 DIAGNOSIS — J449 Chronic obstructive pulmonary disease, unspecified: Secondary | ICD-10-CM

## 2017-02-06 NOTE — Telephone Encounter (Signed)
Patient aware of chest xray results. Encouraged patient to follow-up with PCP. Patient verbalized understanding.

## 2017-02-06 NOTE — Telephone Encounter (Signed)
F/u Message ° °Pt states he is returning RN call .please call back to discuss  °

## 2017-02-06 NOTE — Patient Instructions (Addendum)
Medication Instructions:  Your physician recommends that you continue on your current medications as directed. Please refer to the Current Medication list given to you today.  Labwork: NONE  Testing/Procedures: A chest x-ray takes a picture of the organs and structures inside the chest, including the heart, lungs, and blood vessels. This test can show several things, including, whether the heart is enlarges; whether fluid is building up in the lungs; and whether pacemaker / defibrillator leads are still in place. Go to [facility].  Your physician has requested that you have an echocardiogram. Echocardiography is a painless test that uses sound waves to create images of your heart. It provides your doctor with information about the size and shape of your heart and how well your heart's chambers and valves are working. This procedure takes approximately one hour. There are no restrictions for this procedure.  Your physician has requested that you have a lexiscan myoview. For further information please visit [email or web address]. Please follow instruction sheet, as given.   Follow-Up: Your physician wants you to follow-up in: 12 months with Dr. Johnsie Cancel. You will receive a reminder letter in the mail two months in advance. If you don't receive a letter, please call our office to schedule the follow-up appointment.   If you need a refill on your cardiac medications before your next appointment, please call your pharmacy.

## 2017-02-13 ENCOUNTER — Telehealth (HOSPITAL_COMMUNITY): Payer: Self-pay | Admitting: *Deleted

## 2017-02-13 NOTE — Telephone Encounter (Signed)
Patient's wife, per DPR, given detailed instructions per Myocardial Perfusion Study Information Sheet for the test on 02/16/17. Patient notified to arrive 15 minutes early and that it is imperative to arrive on time for appointment to keep from having the test rescheduled.  If you need to cancel or reschedule your appointment, please call the office within 24 hours of your appointment. . Patient verbalized understanding. Kirstie Peri

## 2017-02-16 ENCOUNTER — Ambulatory Visit (HOSPITAL_BASED_OUTPATIENT_CLINIC_OR_DEPARTMENT_OTHER): Payer: BLUE CROSS/BLUE SHIELD

## 2017-02-16 ENCOUNTER — Ambulatory Visit (HOSPITAL_COMMUNITY): Payer: BLUE CROSS/BLUE SHIELD | Attending: Cardiovascular Disease

## 2017-02-16 ENCOUNTER — Other Ambulatory Visit: Payer: Self-pay

## 2017-02-16 DIAGNOSIS — I517 Cardiomegaly: Secondary | ICD-10-CM | POA: Diagnosis not present

## 2017-02-16 DIAGNOSIS — R55 Syncope and collapse: Secondary | ICD-10-CM | POA: Insufficient documentation

## 2017-02-16 DIAGNOSIS — I4891 Unspecified atrial fibrillation: Secondary | ICD-10-CM

## 2017-02-16 DIAGNOSIS — E785 Hyperlipidemia, unspecified: Secondary | ICD-10-CM | POA: Insufficient documentation

## 2017-02-16 DIAGNOSIS — E119 Type 2 diabetes mellitus without complications: Secondary | ICD-10-CM | POA: Diagnosis not present

## 2017-02-16 LAB — MYOCARDIAL PERFUSION IMAGING
CHL CUP NUCLEAR SRS: 1
CSEPPHR: 120 {beats}/min
RATE: 0.31
Rest HR: 76 {beats}/min
SDS: 1
SSS: 2
TID: 1.1

## 2017-02-16 MED ORDER — TECHNETIUM TC 99M TETROFOSMIN IV KIT
32.2000 | PACK | Freq: Once | INTRAVENOUS | Status: AC | PRN
  Administered 2017-02-16: 32.2 via INTRAVENOUS
  Filled 2017-02-16: qty 33

## 2017-02-16 MED ORDER — TECHNETIUM TC 99M TETROFOSMIN IV KIT
10.6000 | PACK | Freq: Once | INTRAVENOUS | Status: AC | PRN
  Administered 2017-02-16: 10.6 via INTRAVENOUS
  Filled 2017-02-16: qty 11

## 2017-02-16 MED ORDER — REGADENOSON 0.4 MG/5ML IV SOLN
0.4000 mg | Freq: Once | INTRAVENOUS | Status: AC
  Administered 2017-02-16: 0.4 mg via INTRAVENOUS

## 2017-04-08 ENCOUNTER — Other Ambulatory Visit: Payer: Self-pay | Admitting: Family Medicine

## 2017-04-08 DIAGNOSIS — I480 Paroxysmal atrial fibrillation: Secondary | ICD-10-CM

## 2017-05-15 ENCOUNTER — Other Ambulatory Visit: Payer: Self-pay | Admitting: Family Medicine

## 2017-05-15 NOTE — Telephone Encounter (Signed)
Okay to refill? 

## 2017-05-15 NOTE — Telephone Encounter (Signed)
Ok to refill??  Last office visit 01/01/2017.  Last refill 01/04/2017, #1 refills.

## 2017-05-15 NOTE — Telephone Encounter (Signed)
Medication called to pharmacy. 

## 2017-05-31 ENCOUNTER — Other Ambulatory Visit: Payer: Self-pay | Admitting: Family Medicine

## 2017-08-13 ENCOUNTER — Other Ambulatory Visit: Payer: Self-pay | Admitting: *Deleted

## 2017-08-13 MED ORDER — PAROXETINE HCL 30 MG PO TABS: 30.0000 mg | ORAL_TABLET | Freq: Every day | ORAL | 3 refills | Status: DC

## 2017-08-30 ENCOUNTER — Other Ambulatory Visit: Payer: Self-pay | Admitting: Family Medicine

## 2017-08-30 MED ORDER — SIMVASTATIN 40 MG PO TABS: 40.0000 mg | ORAL_TABLET | Freq: Every day | ORAL | 1 refills | Status: DC

## 2017-09-10 ENCOUNTER — Other Ambulatory Visit: Payer: Self-pay | Admitting: Family Medicine

## 2017-09-10 NOTE — Telephone Encounter (Signed)
Ok to refill??  Last office visit 01/01/2017.  Last refill 05/15/2017, #2 refills.

## 2017-11-29 LAB — HM DIABETES EYE EXAM

## 2017-12-04 ENCOUNTER — Encounter: Payer: Self-pay | Admitting: *Deleted

## 2017-12-11 ENCOUNTER — Other Ambulatory Visit: Payer: Self-pay | Admitting: Family Medicine

## 2018-01-14 ENCOUNTER — Other Ambulatory Visit: Payer: Self-pay | Admitting: Family Medicine

## 2018-01-14 NOTE — Telephone Encounter (Signed)
Ok to refill??  Last office visit 01/01/2017.  Last refill 09/10/2017, #1 refills.

## 2018-02-05 NOTE — Progress Notes (Signed)
Cardiology Office Note   Date:  02/07/2018   ID:  Steven Klein, DOB [birth year], MRN [id number]  PCP:  Susy Frizzle, MD  Cardiologist:   Jenkins Rouge, MD   No chief complaint on file.     History of Present Illness: 57 y.o. f/u for PaF and near syncope Originally seen August 2018.   The patient has had 3 separate occasions where he has experienced near syncope. He states that his heart feels like it is "not beathing right".  During those episodes, he will become diaphoretic, pale, short of breath. He will feel like he is about to pass out. Patient has never actually passed out. He denies any chest pain. All 3 instances occur at the end of a hard days work in the heat. The patient is a Development worker, international aid. These events have never occurred without provocation. Each of the 3 events have occurred at the end of the hard work day where he has been extremely dehydrated. The reason he comes in today is because 2 of the events have occurred in the last month and his wife is concerned  TTE done 02/16/17 normal EF 55-60%  Myovue 02/16/17 Normal no ischemia   Hand surgery for skin cancer on right  Still smoking discussed strategies to quit Also discussed diagnosis of COPD  Past Medical History:  Diagnosis Date  . Anxiety and depression   . Atrial fibrillation ([place])   . Cardiomegaly   . Depression   . Diabetes mellitus without complication ([place])   . Hyperlipidemia   . Metabolic syndrome X   . Panic attack     Past Surgical History:  Procedure Laterality Date  . COLONOSCOPY W/ POLYPECTOMY    . ORIF ANKLE FRACTURE       Current Outpatient Medications  Medication Sig Dispense Refill  . ALPRAZolam (XANAX) 0.5 MG tablet TAKE 1 TABLET BY MOUTH EVERY 8 HOURS AS NEEDED 60 tablet 1  . cetirizine (ZYRTEC) 10 MG tablet Take 10 mg by mouth daily.    Marland Kitchen ELIQUIS 5 MG TABS tablet TAKE 1 TABLET BY MOUTH TWICE A DAY 60 tablet 11  . metoprolol succinate (TOPROL-XL) 50 MG 24 hr tablet TAKE 1 TABLET BY  MOUTH ONCE A DAY 90 tablet 3  . PARoxetine (PAXIL) 30 MG tablet Take 1 tablet (30 mg total) by mouth daily. 90 tablet 3  . simvastatin (ZOCOR) 40 MG tablet Take 1 tablet (40 mg total) by mouth at bedtime. 90 tablet 1   No current facility-administered medications for this visit.     Allergies:   Sulfamethoxazole; Ezetimibe-simvastatin; and Sulfonamide derivatives    Social History:  The patient  reports that he has been smoking. He has never used smokeless tobacco. He reports that he does not drink alcohol or use drugs.   Family History:  The patient's family history is not on file. He was adopted.    ROS:  Please see the history of present illness.   Otherwise, review of systems are positive for none.   All other systems are reviewed and negative.    PHYSICAL EXAM: VS:  BP (!) 132/92   Pulse 60   Ht 5\' 6"  (1.676 m)   Wt 257 lb 4 oz (116.7 kg)   SpO2 98%   BMI 41.52 kg/m  , BMI Body mass index is 41.52 kg/m. Affect appropriate Healthy:  appears stated age 1: normal Neck supple with no adenopathy JVP normal no bruits no thyromegaly Lungs Exp wheezing and good  diaphragmatic motion Heart:  S1/S2 no murmur, no rub, gallop or click PMI normal Abdomen: benighn, BS positve, no tenderness, no AAA no bruit.  No HSM or HJR Distal pulses intact with no bruits No edema Neuro non-focal Recent excision of skin cancer right hand  Brace left ankle with fusion     EKG: 01/01/17  afib rate 70 otherwise normal  02/07/18 AFib rate 66 nonspecific ST changes    Recent Labs: No results found for requested labs within last 8760 hours.    Lipid Panel    Component Value Date/Time   CHOL 140 10/13/2015 0853   TRIG 174 (H) 10/13/2015 0853   HDL 34 (L) 10/13/2015 0853   CHOLHDL 4.1 10/13/2015 0853   VLDL 35 (H) 10/13/2015 0853   LDLCALC 71 10/13/2015 0853      Wt Readings from Last 3 Encounters:  02/07/18 257 lb 4 oz (116.7 kg)  02/06/17 234 lb 8 oz (106.4 kg)  01/01/17 244 lb  (110.7 kg)      Other studies Reviewed: Additional studies/ records that were reviewed today include: Cardiology notes and echo Dr Verl Blalock 2011 Primary care notes Dr Dennard Schaumann .    ASSESSMENT AND PLAN:  1.  Atrial fibrillation:  Good rate control and anticoagulation no evidence of long pauses 2. Near Sycnope seems related to heat and dehydration normal TTE/Myovue   3. Anticoagulation continue eliquis labs with primary  4. Cholesterol:  Continue statin  5. Anxiety/Depression has had issues with mothers death and fighting over house with family 80. Smoking significant COPD on exam needs f/u with primary for CXR, PFTls and inhaler Rx  7. CAD: smoking age and cholesterol with syncope normal myovue August 2018  8. Skin cancer:  F/U Dr Valinda Party 2nd [place] surgery   Current medicines are reviewed at length with the patient today.  The patient does not have concerns regarding medicines.  The following changes have been made:  no change  Labs/ tests ordered today None   Orders Placed This Encounter  Procedures  . EKG 12-Lead     Disposition:   FU with me in a year consider ILR and EP referral for recurrence    Signed, Jenkins Rouge, MD  02/07/2018 9:14 AM    [facility] [place], [place], [place]  [address] Phone: [phone]; Fax: [phone]

## 2018-02-07 ENCOUNTER — Ambulatory Visit: Payer: BLUE CROSS/BLUE SHIELD | Admitting: Cardiovascular Disease

## 2018-02-07 ENCOUNTER — Encounter: Payer: Self-pay | Admitting: Cardiovascular Disease

## 2018-02-07 ENCOUNTER — Encounter (INDEPENDENT_AMBULATORY_CARE_PROVIDER_SITE_OTHER): Payer: Self-pay

## 2018-02-07 VITALS — BP 135/75 | HR 60 | Ht 66.0 in | Wt 257.2 lb

## 2018-02-07 DIAGNOSIS — I4891 Unspecified atrial fibrillation: Secondary | ICD-10-CM

## 2018-02-07 DIAGNOSIS — J449 Chronic obstructive pulmonary disease, unspecified: Secondary | ICD-10-CM

## 2018-02-07 DIAGNOSIS — F17200 Nicotine dependence, unspecified, uncomplicated: Secondary | ICD-10-CM | POA: Diagnosis not present

## 2018-02-07 DIAGNOSIS — R55 Syncope and collapse: Secondary | ICD-10-CM

## 2018-02-07 NOTE — Patient Instructions (Addendum)

## 2018-04-02 ENCOUNTER — Ambulatory Visit (INDEPENDENT_AMBULATORY_CARE_PROVIDER_SITE_OTHER): Payer: BLUE CROSS/BLUE SHIELD | Admitting: Family Medicine

## 2018-04-02 ENCOUNTER — Encounter: Payer: Self-pay | Admitting: Family Medicine

## 2018-04-02 VITALS — BP 144/86 | HR 64 | Temp 98.5°F | Resp 20 | Ht 66.0 in | Wt 246.0 lb

## 2018-04-02 DIAGNOSIS — R7303 Prediabetes: Secondary | ICD-10-CM | POA: Diagnosis not present

## 2018-04-02 DIAGNOSIS — N481 Balanitis: Secondary | ICD-10-CM

## 2018-04-02 DIAGNOSIS — R350 Frequency of micturition: Secondary | ICD-10-CM | POA: Diagnosis not present

## 2018-04-02 DIAGNOSIS — Z23 Encounter for immunization: Secondary | ICD-10-CM

## 2018-04-02 LAB — URINALYSIS, ROUTINE W REFLEX MICROSCOPIC
Bacteria, UA: NONE SEEN /HPF
Bilirubin Urine: NEGATIVE
KETONES UR: NEGATIVE
Leukocytes, UA: NEGATIVE
NITRITE: NEGATIVE
SPECIFIC GRAVITY, URINE: 1.015 (ref 1.001–1.03)
WBC, UA: NONE SEEN /HPF (ref 0–5)
pH: 6.5 (ref 5.0–8.0)

## 2018-04-02 LAB — MICROSCOPIC MESSAGE

## 2018-04-02 MED ORDER — NYSTATIN 100000 UNIT/GM EX CREA: 1.0000 "application " | TOPICAL_CREAM | Freq: Three times a day (TID) | CUTANEOUS | 0 refills | Status: DC

## 2018-04-02 MED ORDER — FLUCONAZOLE 150 MG PO TABS: 150.0000 mg | ORAL_TABLET | Freq: Once | ORAL | 0 refills | Status: AC

## 2018-04-02 NOTE — Progress Notes (Signed)
Subjective:    Patient ID: Steven Klein, male    DOB: 12/15/1960, 57 y.o.   MRN: [id number]  HPI Patient has a history of prediabetes.  No one has checked his blood sugar or hemoglobin A1c in more than 2 years.  He states that recently he is been experiencing urinary frequency, urinary urgency, urge incontinence.  He will urinate and then have to void again 30 minutes to an hour later.  He denies any dysuria.  However because of her urge incontinence and occasional dribbling, he is experiencing moisture that is constantly around his penis.  His foreskin will retract over the head of the penis leaving the head of the pain is wet most of the day.  As a result he has a rash on the head of the pain is consistent with Candida balanitis.  He is not checking his blood sugar.  He denies any blurry vision but he does experience polydipsia in addition to his polyuria.  He denies any recent weight loss.  I performed a prostate exam today however the patient has intense pain and anal sphincter contraction with digital rectal exam.  This prevented me from palpating the prostate to assess for BPH.   Past Medical History:  Diagnosis Date  . Anxiety and depression   . Atrial fibrillation ([place])   . Cardiomegaly   . Depression   . Diabetes mellitus without complication ([place])   . Hyperlipidemia   . Metabolic syndrome X   . Panic attack    Past Surgical History:  Procedure Laterality Date  . COLONOSCOPY W/ POLYPECTOMY    . ORIF ANKLE FRACTURE     Current Outpatient Medications on File Prior to Visit  Medication Sig Dispense Refill  . ALPRAZolam (XANAX) 0.5 MG tablet TAKE 1 TABLET BY MOUTH EVERY 8 HOURS AS NEEDED 60 tablet 1  . cetirizine (ZYRTEC) 10 MG tablet Take 10 mg by mouth daily.    Marland Kitchen ELIQUIS 5 MG TABS tablet TAKE 1 TABLET BY MOUTH TWICE A DAY 60 tablet 11  . metoprolol succinate (TOPROL-XL) 50 MG 24 hr tablet TAKE 1 TABLET BY MOUTH ONCE A DAY 90 tablet 3  . PARoxetine (PAXIL) 30 MG tablet Take  1 tablet (30 mg total) by mouth daily. 90 tablet 3  . simvastatin (ZOCOR) 40 MG tablet Take 1 tablet (40 mg total) by mouth at bedtime. 90 tablet 1   No current facility-administered medications on file prior to visit.    Allergies  Allergen Reactions  . Sulfamethoxazole     Tongue swells  . Ezetimibe-Simvastatin     Does not remember  . Sulfonamide Derivatives     REACTION: breathing problems   Social History   Socioeconomic History  . Marital status: Married    Spouse name: Not on file  . Number of children: 1  . Years of education: Not on file  . Highest education level: Not on file  Occupational History  . Occupation: OWNER    Employer: [employer]    Comment: lawn care  Social Needs  . Financial resource strain: Not on file  . Food insecurity:    Worry: Not on file    Inability: Not on file  . Transportation needs:    Medical: Not on file    Non-medical: Not on file  Tobacco Use  . Smoking status: Current Every Day Smoker    Last attempt to quit: 03/15/2010    Years since quitting: 8.0  . Smokeless tobacco: Never  Used  Substance and Sexual Activity  . Alcohol use: No    Alcohol/week: 0.0 standard drinks  . Drug use: No  . Sexual activity: Not on file  Lifestyle  . Physical activity:    Days per week: Not on file    Minutes per session: Not on file  . Stress: Not on file  Relationships  . Social connections:    Talks on phone: Not on file    Gets together: Not on file    Attends religious service: Not on file    Active member of club or organization: Not on file    Attends meetings of clubs or organizations: Not on file    Relationship status: Not on file  . Intimate partner violence:    Fear of current or ex partner: Not on file    Emotionally abused: Not on file    Physically abused: Not on file    Forced sexual activity: Not on file  Other Topics Concern  . Not on file  Social History Narrative  . Not on file     Review of Systems    All other systems reviewed and are negative.      Objective:   Physical Exam  Constitutional: He is oriented to person, place, and time. He appears well-developed and well-nourished. No distress.  HENT:  Right Ear: External ear normal.  Left Ear: External ear normal.  Nose: Nose normal.  Mouth/Throat: Oropharynx is clear and moist. No oropharyngeal exudate.  Eyes: Conjunctivae are normal.  Neck: Neck supple. No thyromegaly present.  Cardiovascular: Normal rate, regular rhythm, normal heart sounds and intact distal pulses. Exam reveals no gallop and no friction rub.  No murmur heard. Pulmonary/Chest: Effort normal and breath sounds normal. No respiratory distress. He has no wheezes. He has no rales.  Abdominal: Soft. Bowel sounds are normal. He exhibits no distension and no mass. There is no tenderness. There is no rebound and no guarding.  Musculoskeletal: He exhibits no edema.  Lymphadenopathy:    He has no cervical adenopathy.  Neurological: He is alert and oriented to person, place, and time. He has normal reflexes. No cranial nerve deficit. He exhibits normal muscle tone. Coordination normal.  Skin: He is not diaphoretic.  Vitals reviewed.         Assessment & Plan:  Frequent urination - Plan: Urinalysis, Routine w reflex microscopic, BASIC METABOLIC PANEL WITH GFR, PSA, Hemoglobin A1c  Prediabetes - Plan: Urinalysis, Routine w reflex microscopic, BASIC METABOLIC PANEL WITH GFR, Hemoglobin A1c  Balanitis - Plan: fluconazole (DIFLUCAN) 150 MG tablet, nystatin cream (MYCOSTATIN) Differential diagnosis includes BPH, uncontrolled diabetes causing polyuria, overactive bladder.  I am unable to palpate his prostate today due to pain with rectal exam and spasm of the anal sphincter.  Therefore I will check a PSA.  If PSA is not elevated, I will treat the patient is overactive bladder.  I will also check hemoglobin A1c to ensure that the polyuria is not due to hyperglycemia.  I will  treat the Candida balanitis with Diflucan 150 mg p.o. x1 followed by nystatin cream applied 3 times a day.  I encouraged the patient to try to keep the head of the penis dry as much as possible.  If the patient's PSA is elevated, we may need to treat the patient for BPH instead of overactive bladder.

## 2018-04-02 NOTE — Addendum Note (Signed)
Addended by: Shary Decamp B on: 04/02/2018 11:40 AM   Modules accepted: Orders

## 2018-04-03 ENCOUNTER — Other Ambulatory Visit: Payer: Self-pay | Admitting: Family Medicine

## 2018-04-03 LAB — BASIC METABOLIC PANEL WITH GFR
BUN: 7 mg/dL (ref 7–25)
CALCIUM: 9.2 mg/dL (ref 8.6–10.3)
CO2: 25 mmol/L (ref 20–32)
Chloride: 95 mmol/L — ABNORMAL LOW (ref 98–110)
Creat: 0.82 mg/dL (ref 0.70–1.33)
GFR, EST AFRICAN AMERICAN: 114 mL/min/{1.73_m2} (ref >=60)
GFR, Est Non African American: 98 mL/min/{1.73_m2} (ref >=60)
Glucose, Bld: 360 mg/dL — ABNORMAL HIGH (ref 65–99)
POTASSIUM: 4.3 mmol/L (ref 3.5–5.3)
SODIUM: 133 mmol/L — AB (ref 135–146)

## 2018-04-03 LAB — HEMOGLOBIN A1C
Hgb A1c MFr Bld: 11.4 % of total Hgb — ABNORMAL HIGH (ref <5.7)
Mean Plasma Glucose: 280 (calc)
eAG (mmol/L): 15.5 (calc)

## 2018-04-03 LAB — PSA: PSA: 0.3 ng/mL (ref <=4.0)

## 2018-04-03 MED ORDER — METFORMIN HCL ER 500 MG PO TB24: 1000.0000 mg | ORAL_TABLET | Freq: Every day | ORAL | 1 refills | Status: DC

## 2018-04-03 MED ORDER — GLIPIZIDE ER 5 MG PO TB24: 5.0000 mg | ORAL_TABLET | Freq: Every day | ORAL | 1 refills | Status: DC

## 2018-04-04 ENCOUNTER — Other Ambulatory Visit: Payer: Self-pay | Admitting: Family Medicine

## 2018-04-05 ENCOUNTER — Encounter: Payer: Self-pay | Admitting: Family Medicine

## 2018-04-05 ENCOUNTER — Ambulatory Visit (INDEPENDENT_AMBULATORY_CARE_PROVIDER_SITE_OTHER): Payer: BLUE CROSS/BLUE SHIELD | Admitting: Family Medicine

## 2018-04-05 VITALS — BP 130/80 | HR 78 | Temp 98.6°F | Resp 16 | Wt 246.0 lb

## 2018-04-05 DIAGNOSIS — E119 Type 2 diabetes mellitus without complications: Secondary | ICD-10-CM | POA: Diagnosis not present

## 2018-04-05 MED ORDER — BLOOD GLUCOSE SYSTEM PAK KIT: PACK | 0 refills | Status: DC

## 2018-04-05 MED ORDER — BLOOD GLUCOSE TEST VI STRP: ORAL_STRIP | 3 refills | Status: DC

## 2018-04-05 MED ORDER — BLOOD GLUCOSE METER KIT: PACK | 0 refills | Status: AC

## 2018-04-05 MED ORDER — LANCETS MISC: 3 refills | Status: DC

## 2018-04-05 NOTE — Progress Notes (Signed)
Subjective:    Patient ID: Steven Klein, male    DOB: Jan 13, 1961, 57 y.o.   MRN: [id number]  HPI  04/02/18 Patient has a history of prediabetes.  No one has checked his blood sugar or hemoglobin A1c in more than 2 years.  He states that recently he is been experiencing urinary frequency, urinary urgency, urge incontinence.  He will urinate and then have to void again 30 minutes to an hour later.  He denies any dysuria.  However because of her urge incontinence and occasional dribbling, he is experiencing moisture that is constantly around his penis.  His foreskin will retract over the head of the penis leaving the head of the pain is wet most of the day.  As a result he has a rash on the head of the pain is consistent with Candida balanitis.  He is not checking his blood sugar.  He denies any blurry vision but he does experience polydipsia in addition to his polyuria.  He denies any recent weight loss.  I performed a prostate exam today however the patient has intense pain and anal sphincter contraction with digital rectal exam.  This prevented me from palpating the prostate to assess for BPH.  At that time, my plan was: Differential diagnosis includes BPH, uncontrolled diabetes causing polyuria, overactive bladder.  I am unable to palpate his prostate today due to pain with rectal exam and spasm of the anal sphincter.  Therefore I will check a PSA.  If PSA is not elevated, I will treat the patient is overactive bladder.  I will also check hemoglobin A1c to ensure that the polyuria is not due to hyperglycemia.  I will treat the Candida balanitis with Diflucan 150 mg p.o. x1 followed by nystatin cream applied 3 times a day.  I encouraged the patient to try to keep the head of the penis dry as much as possible.  If the patient's PSA is elevated, we may need to treat the patient for BPH instead of overactive bladder.  04/05/18 Office Visit on 04/02/2018  Component Date Value Ref Range Status  . Color,  Urine 04/02/2018 YELLOW  YELLOW Final  . APPearance 04/02/2018 CLEAR  CLEAR Final  . Specific Gravity, Urine 04/02/2018 1.015  1.001 - 1.03 Final  . pH 04/02/2018 6.5  5.0 - 8.0 Final  . Glucose, UA 04/02/2018 3+* NEGATIVE Final  . Bilirubin Urine 04/02/2018 NEGATIVE  NEGATIVE Final  . Ketones, ur 04/02/2018 NEGATIVE  NEGATIVE Final  . Hgb urine dipstick 04/02/2018 TRACE* NEGATIVE Final  . Protein, ur 04/02/2018 TRACE* NEGATIVE Final  . Nitrite 04/02/2018 NEGATIVE  NEGATIVE Final  . Leukocytes, UA 04/02/2018 NEGATIVE  NEGATIVE Final  . WBC, UA 04/02/2018 NONE SEEN  0 - 5 /HPF Final  . RBC / HPF 04/02/2018 0-2  0 - 2 /HPF Final  . Squamous Epithelial / LPF 04/02/2018 0-5  < OR = 5 /HPF Final  . Bacteria, UA 04/02/2018 NONE SEEN  NONE SEEN /HPF Final  . Glucose, Bld 04/02/2018 360* 65 - 99 mg/dL Final   Comment: .            Fasting reference interval . For someone without known diabetes, a glucose value >125 mg/dL indicates that they may have diabetes and this should be confirmed with a follow-up test. .   . BUN 04/02/2018 7  7 - 25 mg/dL Final  . Creat 04/02/2018 0.82  0.70 - 1.33 mg/dL Final   Comment: For patients >49 years of  age, the reference limit for Creatinine is approximately 13% higher for people identified as African-American. .   . GFR, Est Non African American 04/02/2018 98  > OR = 60 mL/min/1.51m2 Final  . GFR, Est African American 04/02/2018 114  > OR = 60 mL/min/1.78m2 Final  . BUN/Creatinine Ratio 79/89/2119 NOT APPLICABLE  6 - 22 (calc) Final  . Sodium 04/02/2018 133* 135 - 146 mmol/L Final  . Potassium 04/02/2018 4.3  3.5 - 5.3 mmol/L Final  . Chloride 04/02/2018 95* 98 - 110 mmol/L Final  . CO2 04/02/2018 25  20 - 32 mmol/L Final  . Calcium 04/02/2018 9.2  8.6 - 10.3 mg/dL Final  . PSA 04/02/2018 0.3  < OR = 4.0 ng/mL Final   Comment: The total PSA value from this assay system is  standardized against the WHO standard. The test  result will be  approximately 20% lower when compared  to the equimolar-standardized total PSA (Beckman  Coulter). Comparison of serial PSA results should be  interpreted with this fact in mind. . This test was performed using the Siemens  chemiluminescent method. Values obtained from  different assay methods cannot be used interchangeably. PSA levels, regardless of value, should not be interpreted as absolute evidence of the presence or absence of disease.   . Hgb A1c MFr Bld 04/02/2018 11.4* <5.7 % of total Hgb Final   Comment: For someone without known diabetes, a hemoglobin A1c value of 6.5% or greater indicates that they may have  diabetes and this should be confirmed with a follow-up  test. . For someone with known diabetes, a value <7% indicates  that their diabetes is well controlled and a value  greater than or equal to 7% indicates suboptimal  control. A1c targets should be individualized based on  duration of diabetes, age, comorbid conditions, and  other considerations. . Currently, no consensus exists regarding use of hemoglobin A1c for diagnosis of diabetes for children. .   . Mean Plasma Glucose 04/02/2018 280  (calc) Final  . eAG (mmol/L) 04/02/2018 15.5  (calc) Final  . Note 04/02/2018    Final   Comment: This urine was analyzed for the presence of WBC,  RBC, bacteria, casts, and other formed elements.  Only those elements seen were reported. . .    Patient is here today to discuss his lab work.  He admits to drinking a sixpack of sodas every day in addition to sweet tea.  He also eats a diet high in carbohydrates including a lot of fast food, bread, potatoes etc.  He is not getting regular exercise.  We started the patient on glipizide and metformin as documented below.  He has drastically reduced his consumption of sodas and is only drinking water.  He is avoiding as many carbohydrates as possible.  He states that his fasting blood sugars have been around 160.  His random  blood sugars have been under 200.  He denies any chest pain or shortness of breath.  He states that his polyuria has already improved.  The balanitis is also improving.   Past Medical History:  Diagnosis Date  . Anxiety and depression   . Atrial fibrillation ([place])   . Cardiomegaly   . Depression   . Diabetes mellitus without complication ([place])   . Hyperlipidemia   . Metabolic syndrome X   . Panic attack    Past Surgical History:  Procedure Laterality Date  . COLONOSCOPY W/ POLYPECTOMY    . ORIF ANKLE FRACTURE  Current Outpatient Medications on File Prior to Visit  Medication Sig Dispense Refill  . ALPRAZolam (XANAX) 0.5 MG tablet TAKE 1 TABLET BY MOUTH EVERY 8 HOURS AS NEEDED 60 tablet 1  . cetirizine (ZYRTEC) 10 MG tablet Take 10 mg by mouth daily.    Marland Kitchen ELIQUIS 5 MG TABS tablet TAKE 1 TABLET BY MOUTH TWICE A DAY 60 tablet 11  . glipiZIDE (GLIPIZIDE XL) 5 MG 24 hr tablet Take 1 tablet (5 mg total) by mouth daily with breakfast. 90 tablet 1  . metFORMIN (GLUCOPHAGE-XR) 500 MG 24 hr tablet Take 2 tablets (1,000 mg total) by mouth daily with breakfast. 180 tablet 1  . metoprolol succinate (TOPROL-XL) 50 MG 24 hr tablet TAKE 1 TABLET BY MOUTH ONCE A DAY 90 tablet 3  . nystatin cream (MYCOSTATIN) Apply 1 application topically 3 (three) times daily. 30 g 0  . PARoxetine (PAXIL) 30 MG tablet Take 1 tablet (30 mg total) by mouth daily. 90 tablet 3  . simvastatin (ZOCOR) 40 MG tablet TAKE 1 TABLET BY MOUTH EVERYDAY AT BEDTIME 90 tablet 1   No current facility-administered medications on file prior to visit.    Allergies  Allergen Reactions  . Sulfamethoxazole     Tongue swells  . Ezetimibe-Simvastatin     Does not remember  . Sulfonamide Derivatives     REACTION: breathing problems   Social History   Socioeconomic History  . Marital status: Married    Spouse name: Not on file  . Number of children: 1  . Years of education: Not on file  . Highest education level: Not on file   Occupational History  . Occupation: OWNER    Employer: [employer]    Comment: lawn care  Social Needs  . Financial resource strain: Not on file  . Food insecurity:    Worry: Not on file    Inability: Not on file  . Transportation needs:    Medical: Not on file    Non-medical: Not on file  Tobacco Use  . Smoking status: Current Every Day Smoker    Last attempt to quit: 03/15/2010    Years since quitting: 8.0  . Smokeless tobacco: Never Used  Substance and Sexual Activity  . Alcohol use: No    Alcohol/week: 0.0 standard drinks  . Drug use: No  . Sexual activity: Not on file  Lifestyle  . Physical activity:    Days per week: Not on file    Minutes per session: Not on file  . Stress: Not on file  Relationships  . Social connections:    Talks on phone: Not on file    Gets together: Not on file    Attends religious service: Not on file    Active member of club or organization: Not on file    Attends meetings of clubs or organizations: Not on file    Relationship status: Not on file  . Intimate partner violence:    Fear of current or ex partner: Not on file    Emotionally abused: Not on file    Physically abused: Not on file    Forced sexual activity: Not on file  Other Topics Concern  . Not on file  Social History Narrative  . Not on file     Review of Systems  All other systems reviewed and are negative.      Objective:   Physical Exam  Constitutional: He is oriented to person, place, and time. He appears well-developed and  well-nourished. No distress.  HENT:  Right Ear: External ear normal.  Left Ear: External ear normal.  Nose: Nose normal.  Mouth/Throat: Oropharynx is clear and moist. No oropharyngeal exudate.  Eyes: Conjunctivae are normal.  Neck: Neck supple. No thyromegaly present.  Cardiovascular: Normal rate, regular rhythm, normal heart sounds and intact distal pulses. Exam reveals no gallop and no friction rub.  No murmur  heard. Pulmonary/Chest: Effort normal and breath sounds normal. No respiratory distress. He has no wheezes. He has no rales.  Abdominal: Soft. Bowel sounds are normal. He exhibits no distension and no mass. There is no tenderness. There is no rebound and no guarding.  Musculoskeletal: He exhibits no edema.  Lymphadenopathy:    He has no cervical adenopathy.  Neurological: He is alert and oriented to person, place, and time. He has normal reflexes. No cranial nerve deficit. He exhibits normal muscle tone. Coordination normal.  Skin: He is not diaphoretic.  Vitals reviewed.         Assessment & Plan:   New onset type 2 diabetes mellitus ([place])  I spent 30 minutes today with the patient and his wife discussing his diagnosis.  He will check his fasting blood sugars and 2-hour postprandial sugars over the next month and notify me of the values.  Goal fasting blood sugars between 80 and 130.  Goal 2-hour postprandial sugars are between 80 and 160.  Recheck hemoglobin A1c in 3 months.  At that time I will repeat a fasting lipid panel.  Goal LDL is less than 100.  We will also check a urine microalbumin at that time to screen for diabetic nephropathy.  Blood pressure today is adequately controlled at 130/80.  I recommended a low carbohydrate diet.  I recommended less than 45 g of carbs per meal.  I also recommended that we monitor closely for hypoglycemia given the fact he is on glipizide.

## 2018-04-12 ENCOUNTER — Ambulatory Visit: Payer: BLUE CROSS/BLUE SHIELD | Admitting: Family Medicine

## 2018-04-12 ENCOUNTER — Telehealth: Payer: Self-pay | Admitting: Family Medicine

## 2018-04-12 NOTE — Telephone Encounter (Signed)
Pt has been having blurred vision since starting metformin and glipizide and would like to know what you recommend for him to do?? His bs has been really good. FBS 127-138 (he did read on the glipizide that it can cause blurred vision)

## 2018-04-12 NOTE — Telephone Encounter (Signed)
I do not believe the blurry vision is due to the glipizide and metformin.    It could be due to his body adjusting to the blood sugars.    As long as he is not having hypoglycemic episodes, less than 80, I would not change the medication.  Instead I would recommend ophthalmology consultation to rule out diabetic retinopathy.

## 2018-04-12 NOTE — Telephone Encounter (Addendum)
Patient aware of providers recommendations via vm 

## 2018-04-12 NOTE — Telephone Encounter (Signed)
OK to leave message on answering machine

## 2018-04-17 ENCOUNTER — Other Ambulatory Visit: Payer: Self-pay | Admitting: Family Medicine

## 2018-04-17 DIAGNOSIS — I480 Paroxysmal atrial fibrillation: Secondary | ICD-10-CM

## 2018-04-23 ENCOUNTER — Encounter: Payer: Self-pay | Admitting: Gastroenterology

## 2018-05-13 ENCOUNTER — Other Ambulatory Visit: Payer: Self-pay | Admitting: Family Medicine

## 2018-05-13 NOTE — Telephone Encounter (Signed)
Pt is requesting refill on Xanax   LOV: 04/05/18  LRF:  01/14/18

## 2018-05-15 ENCOUNTER — Telehealth: Payer: Self-pay | Admitting: *Deleted

## 2018-05-15 MED ORDER — ALPRAZOLAM 1 MG PO TABS: 0.5000 mg | ORAL_TABLET | Freq: Three times a day (TID) | ORAL | 1 refills | Status: DC | PRN

## 2018-05-15 NOTE — Telephone Encounter (Signed)
Received fax requesting alternative to Xanax 0.5mg .   Ok to order 1mg ?

## 2018-05-24 ENCOUNTER — Other Ambulatory Visit: Payer: Self-pay | Admitting: Family Medicine

## 2018-05-24 MED ORDER — BLOOD GLUCOSE TEST VI STRP: ORAL_STRIP | 3 refills | Status: DC

## 2018-05-28 ENCOUNTER — Other Ambulatory Visit: Payer: Self-pay | Admitting: *Deleted

## 2018-05-28 MED ORDER — LANCETS MISC: 3 refills | Status: AC

## 2018-05-28 MED ORDER — BLOOD GLUCOSE TEST VI STRP: ORAL_STRIP | 3 refills | Status: DC

## 2018-06-04 ENCOUNTER — Other Ambulatory Visit: Payer: Self-pay | Admitting: Family Medicine

## 2018-06-04 MED ORDER — ACCU-CHEK GUIDE ME W/DEVICE KIT: 1.0000 | PACK | Freq: Two times a day (BID) | 2 refills | Status: AC

## 2018-06-04 MED ORDER — GLUCOSE BLOOD VI STRP: ORAL_STRIP | 3 refills | Status: DC

## 2018-06-23 ENCOUNTER — Encounter: Payer: Self-pay | Admitting: Gastroenterology

## 2018-07-08 ENCOUNTER — Ambulatory Visit (INDEPENDENT_AMBULATORY_CARE_PROVIDER_SITE_OTHER): Payer: BLUE CROSS/BLUE SHIELD | Admitting: Family Medicine

## 2018-07-08 ENCOUNTER — Encounter: Payer: Self-pay | Admitting: Family Medicine

## 2018-07-08 VITALS — BP 138/96 | HR 80 | Temp 98.1°F | Resp 18 | Ht 66.0 in | Wt 243.0 lb

## 2018-07-08 DIAGNOSIS — E7800 Pure hypercholesterolemia, unspecified: Secondary | ICD-10-CM

## 2018-07-08 DIAGNOSIS — I4891 Unspecified atrial fibrillation: Secondary | ICD-10-CM

## 2018-07-08 DIAGNOSIS — E119 Type 2 diabetes mellitus without complications: Secondary | ICD-10-CM | POA: Diagnosis not present

## 2018-07-08 NOTE — Progress Notes (Signed)
Subjective:    Patient ID: Steven Klein, male    DOB: [birth year], 58 y.o.   MRN: [id number]  HPI  04/02/18 Patient has a history of prediabetes.  No one has checked his blood sugar or hemoglobin A1c in more than 2 years.  He states that recently he is been experiencing urinary frequency, urinary urgency, urge incontinence.  He will urinate and then have to void again 30 minutes to an hour later.  He denies any dysuria.  However because of her urge incontinence and occasional dribbling, he is experiencing moisture that is constantly around his penis.  His foreskin will retract over the head of the penis leaving the head of the pain is wet most of the day.  As a result he has a rash on the head of the pain is consistent with Candida balanitis.  He is not checking his blood sugar.  He denies any blurry vision but he does experience polydipsia in addition to his polyuria.  He denies any recent weight loss.  I performed a prostate exam today however the patient has intense pain and anal sphincter contraction with digital rectal exam.  This prevented me from palpating the prostate to assess for BPH.  At that time, my plan was: Differential diagnosis includes BPH, uncontrolled diabetes causing polyuria, overactive bladder.  I am unable to palpate his prostate today due to pain with rectal exam and spasm of the anal sphincter.  Therefore I will check a PSA.  If PSA is not elevated, I will treat the patient is overactive bladder.  I will also check hemoglobin A1c to ensure that the polyuria is not due to hyperglycemia.  I will treat the Candida balanitis with Diflucan 150 mg p.o. x1 followed by nystatin cream applied 3 times a day.  I encouraged the patient to try to keep the head of the penis dry as much as possible.  If the patient's PSA is elevated, we may need to treat the patient for BPH instead of overactive bladder.  04/05/18  Patient is here today to discuss his lab work.  He admits to drinking a  sixpack of sodas every day in addition to sweet tea.  He also eats a diet high in carbohydrates including a lot of fast food, bread, potatoes etc.  He is not getting regular exercise.  We started the patient on glipizide and metformin as documented below.  He has drastically reduced his consumption of sodas and is only drinking water.  He is avoiding as many carbohydrates as possible.  He states that his fasting blood sugars have been around 160.  His random blood sugars have been under 200.  He denies any chest pain or shortness of breath.  He states that his polyuria has already improved.  The balanitis is also improving.  At that time, my plan was: I spent 30 minutes today with the patient and his wife discussing his diagnosis.  He will check his fasting blood sugars and 2-hour postprandial sugars over the next month and notify me of the values.  Goal fasting blood sugars between 80 and 130.  Goal 2-hour postprandial sugars are between 80 and 160.  Recheck hemoglobin A1c in 3 months.  At that time I will repeat a fasting lipid panel.  Goal LDL is less than 100.  We will also check a urine microalbumin at that time to screen for diabetic nephropathy.  Blood pressure today is adequately controlled at 130/80.  I recommended a low carbohydrate  diet.  I recommended less than 45 g of carbs per meal.  I also recommended that we monitor closely for hypoglycemia given the fact he is on glipizide.  07/08/18 Here for follow up. Patient quit glipizide due to blurry vision.  However on metformin alone, the patient's fasting blood sugars are between 80 and 120.  His 2-hour postprandial sugars are between 101 140.  He seldom has any hypoglycemic episodes.  He has not had any sugars greater than 200 in over 2 months.  He denies any polyuria, polydipsia, or blurry vision.  He denies any chest pain shortness of breath or dyspnea on exertion.  He denies any myalgias or right upper quadrant pain.  He has had his diabetic eye exam  and it was normal. Past Medical History:  Diagnosis Date  . Adenomatous colon polyp   . Anxiety and depression   . Atrial fibrillation ([place])   . Cardiomegaly   . Depression   . Diabetes mellitus without complication ([place])   . Hyperlipidemia   . Metabolic syndrome X   . Panic attack    Past Surgical History:  Procedure Laterality Date  . COLONOSCOPY W/ POLYPECTOMY    . ORIF ANKLE FRACTURE     Current Outpatient Medications on File Prior to Visit  Medication Sig Dispense Refill  . ALPRAZolam (XANAX) 1 MG tablet Take 0.5 tablets (0.5 mg total) by mouth every 8 (eight) hours as needed. 30 tablet 1  . blood glucose meter kit and supplies Dispense based on patient and insurance preference. Use up to 3 times as directed. (FOR ICD-10 E10.9, E11.9). 1 each 0  . Blood Glucose Monitoring Suppl (ACCU-CHEK GUIDE ME) w/Device KIT 1 strip by Does not apply route 2 (two) times daily. (Check BS BID - DX E11.9) 1 kit 2  . cetirizine (ZYRTEC) 10 MG tablet Take 10 mg by mouth daily.    Marland Kitchen ELIQUIS 5 MG TABS tablet TAKE 1 TABLET BY MOUTH TWICE A DAY 60 tablet 11  . glipiZIDE (GLIPIZIDE XL) 5 MG 24 hr tablet Take 1 tablet (5 mg total) by mouth daily with breakfast. 90 tablet 1  . glucose blood (ACCU-CHEK GUIDE) test strip Check BS BID DX: E11.9 100 each 3  . Lancets MISC Please dispense based on patient and insurance preference. Use as directed to monitor FSBS 2x daily. Dx: E11.9 100 each 3  . metFORMIN (GLUCOPHAGE-XR) 500 MG 24 hr tablet Take 2 tablets (1,000 mg total) by mouth daily with breakfast. 180 tablet 1  . metoprolol succinate (TOPROL-XL) 50 MG 24 hr tablet TAKE 1 TABLET BY MOUTH ONCE A DAY 90 tablet 3  . nystatin cream (MYCOSTATIN) Apply 1 application topically 3 (three) times daily. 30 g 0  . PARoxetine (PAXIL) 30 MG tablet Take 1 tablet (30 mg total) by mouth daily. 90 tablet 3  . simvastatin (ZOCOR) 40 MG tablet TAKE 1 TABLET BY MOUTH EVERYDAY AT BEDTIME 90 tablet 1   No current  facility-administered medications on file prior to visit.    Allergies  Allergen Reactions  . Sulfamethoxazole     Tongue swells  . Ezetimibe-Simvastatin     Does not remember  . Sulfonamide Derivatives     REACTION: breathing problems   Social History   Socioeconomic History  . Marital status: Married    Spouse name: Not on file  . Number of children: 1  . Years of education: Not on file  . Highest education level: Not on file  Occupational History  .  Occupation: OWNER    Employer: [employer]    Comment: lawn care  Social Needs  . Financial resource strain: Not on file  . Food insecurity:    Worry: Not on file    Inability: Not on file  . Transportation needs:    Medical: Not on file    Non-medical: Not on file  Tobacco Use  . Smoking status: Current Every Day Smoker    Last attempt to quit: 03/15/2010    Years since quitting: 8.3  . Smokeless tobacco: Never Used  Substance and Sexual Activity  . Alcohol use: No    Alcohol/week: 0.0 standard drinks  . Drug use: No  . Sexual activity: Not on file  Lifestyle  . Physical activity:    Days per week: Not on file    Minutes per session: Not on file  . Stress: Not on file  Relationships  . Social connections:    Talks on phone: Not on file    Gets together: Not on file    Attends religious service: Not on file    Active member of club or organization: Not on file    Attends meetings of clubs or organizations: Not on file    Relationship status: Not on file  . Intimate partner violence:    Fear of current or ex partner: Not on file    Emotionally abused: Not on file    Physically abused: Not on file    Forced sexual activity: Not on file  Other Topics Concern  . Not on file  Social History Narrative  . Not on file     Review of Systems  All other systems reviewed and are negative.      Objective:   Physical Exam Vitals signs reviewed.  Constitutional:      General: He is not in acute  distress.    Appearance: He is well-developed. He is not diaphoretic.  HENT:     Right Ear: External ear normal.     Left Ear: External ear normal.     Nose: Nose normal.     Mouth/Throat:     Pharynx: No oropharyngeal exudate.  Eyes:     Conjunctiva/sclera: Conjunctivae normal.  Neck:     Musculoskeletal: Neck supple.     Thyroid: No thyromegaly.  Cardiovascular:     Rate and Rhythm: Normal rate and regular rhythm.     Heart sounds: Normal heart sounds. No murmur. No friction rub. No gallop.   Pulmonary:     Effort: Pulmonary effort is normal. No respiratory distress.     Breath sounds: Normal breath sounds. No wheezing or rales.  Abdominal:     General: Bowel sounds are normal. There is no distension.     Palpations: Abdomen is soft. There is no mass.     Tenderness: There is no abdominal tenderness. There is no guarding or rebound.  Lymphadenopathy:     Cervical: No cervical adenopathy.  Neurological:     Mental Status: He is alert and oriented to person, place, and time.     Cranial Nerves: No cranial nerve deficit.     Motor: No abnormal muscle tone.     Coordination: Coordination normal.     Deep Tendon Reflexes: Reflexes are normal and symmetric.           Assessment & Plan:  New onset type 2 diabetes mellitus ([place]) - Plan: Hemoglobin A1c, CBC with Differential/Platelet, COMPLETE METABOLIC PANEL WITH GFR, Lipid panel, Microalbumin, urine  Pure hypercholesterolemia  Atrial fibrillation, unspecified type ([place])  Metformin alone seems to be controlling his blood sugars now that he is changed his diet dramatically.  He stopped all sodas and has drastically limited his carbs and lost some weight.  I will check hemoglobin A1c.  Goal hemoglobin A1c is less than 7.  Blood pressure today is slightly elevated however he has whitecoat syndrome.  I have asked him to check his blood pressure every day at home for the next week and notify me of the values.  If greater than 140/90  we will titrate his medication accordingly.  Also check a fasting lipid panel.  Goal LDL is less than 100

## 2018-07-09 LAB — COMPLETE METABOLIC PANEL WITH GFR
AG Ratio: 1.5 (calc) (ref 1.0–2.5)
ALT: 12 U/L (ref 9–46)
AST: 12 U/L (ref 10–35)
Albumin: 4 g/dL (ref 3.6–5.1)
Alkaline phosphatase (APISO): 100 U/L (ref 40–115)
BUN: 8 mg/dL (ref 7–25)
CO2: 29 mmol/L (ref 20–32)
Calcium: 9.2 mg/dL (ref 8.6–10.3)
Chloride: 104 mmol/L (ref 98–110)
Creat: 0.85 mg/dL (ref 0.70–1.33)
GFR, EST AFRICAN AMERICAN: 111 mL/min/{1.73_m2} (ref >=60)
GFR, EST NON AFRICAN AMERICAN: 96 mL/min/{1.73_m2} (ref >=60)
GLUCOSE: 122 mg/dL — AB (ref 65–99)
Globulin: 2.7 g/dL (calc) (ref 1.9–3.7)
Potassium: 4.5 mmol/L (ref 3.5–5.3)
Sodium: 141 mmol/L (ref 135–146)
TOTAL PROTEIN: 6.7 g/dL (ref 6.1–8.1)
Total Bilirubin: 0.7 mg/dL (ref 0.2–1.2)

## 2018-07-09 LAB — MICROALBUMIN, URINE: Microalb, Ur: 1.2 mg/dL

## 2018-07-09 LAB — CBC WITH DIFFERENTIAL/PLATELET
ABSOLUTE MONOCYTES: 655 {cells}/uL (ref 200–950)
BASOS PCT: 0.9 %
Basophils Absolute: 94 cells/uL (ref 0–200)
EOS ABS: 239 {cells}/uL (ref 15–500)
Eosinophils Relative: 2.3 %
HCT: 50.4 % — ABNORMAL HIGH (ref 38.5–50.0)
HEMOGLOBIN: 16.6 g/dL (ref 13.2–17.1)
Lymphs Abs: 2434 cells/uL (ref 850–3900)
MCH: 28.8 pg (ref 27.0–33.0)
MCHC: 32.9 g/dL (ref 32.0–36.0)
MCV: 87.3 fL (ref 80.0–100.0)
MONOS PCT: 6.3 %
MPV: 10.6 fL (ref 7.5–12.5)
Neutro Abs: 6978 cells/uL (ref 1500–7800)
Neutrophils Relative %: 67.1 %
PLATELETS: 257 10*3/uL (ref 140–400)
RBC: 5.77 10*6/uL (ref 4.20–5.80)
RDW: 13.3 % (ref 11.0–15.0)
TOTAL LYMPHOCYTE: 23.4 %
WBC: 10.4 10*3/uL (ref 3.8–10.8)

## 2018-07-09 LAB — LIPID PANEL
Cholesterol: 153 mg/dL (ref <200)
HDL: 41 mg/dL (ref >40)
LDL Cholesterol (Calc): 88 mg/dL (calc)
Non-HDL Cholesterol (Calc): 112 mg/dL (calc) (ref <130)
TRIGLYCERIDES: 139 mg/dL (ref <150)
Total CHOL/HDL Ratio: 3.7 (calc) (ref <5.0)

## 2018-07-09 LAB — HEMOGLOBIN A1C
HEMOGLOBIN A1C: 6.6 %{Hb} — AB (ref <5.7)
Mean Plasma Glucose: 143 (calc)
eAG (mmol/L): 7.9 (calc)

## 2018-08-27 ENCOUNTER — Other Ambulatory Visit: Payer: Self-pay | Admitting: Family Medicine

## 2018-08-27 NOTE — Telephone Encounter (Signed)
Ok to refill??  Last office visit 07/08/2018.  Last refill 05/15/2018, #1 refill.

## 2018-08-30 ENCOUNTER — Other Ambulatory Visit: Payer: Self-pay | Admitting: *Deleted

## 2018-08-30 MED ORDER — PAROXETINE HCL 30 MG PO TABS: 30.0000 mg | ORAL_TABLET | Freq: Every day | ORAL | 3 refills | Status: DC

## 2018-09-23 ENCOUNTER — Other Ambulatory Visit: Payer: Self-pay | Admitting: Family Medicine

## 2018-09-23 MED ORDER — METFORMIN HCL ER 500 MG PO TB24: 1000.0000 mg | ORAL_TABLET | Freq: Every day | ORAL | 1 refills | Status: DC

## 2018-12-02 LAB — HM DIABETES EYE EXAM

## 2018-12-10 ENCOUNTER — Other Ambulatory Visit: Payer: Self-pay | Admitting: Family Medicine

## 2018-12-23 ENCOUNTER — Encounter: Payer: Self-pay | Admitting: *Deleted

## 2019-02-04 ENCOUNTER — Other Ambulatory Visit: Payer: Self-pay | Admitting: Family Medicine

## 2019-02-11 ENCOUNTER — Other Ambulatory Visit: Payer: Self-pay | Admitting: Family Medicine

## 2019-02-11 NOTE — Telephone Encounter (Signed)
Ok to refill??  Last office visit 07/08/2018.  Last refill 08/27/2018, #1 refill.

## 2019-03-12 ENCOUNTER — Other Ambulatory Visit: Payer: Self-pay | Admitting: Family Medicine

## 2019-03-12 MED ORDER — SIMVASTATIN 40 MG PO TABS: ORAL_TABLET | ORAL | 1 refills | Status: DC

## 2019-05-25 ENCOUNTER — Other Ambulatory Visit: Payer: Self-pay | Admitting: Family Medicine

## 2019-05-25 DIAGNOSIS — I480 Paroxysmal atrial fibrillation: Secondary | ICD-10-CM

## 2019-05-26 NOTE — Telephone Encounter (Signed)
Ok to refill??  Last office visit 07/08/2018.  Last refill 02/11/2019, #1 refill.

## 2019-06-19 ENCOUNTER — Other Ambulatory Visit: Payer: Self-pay | Admitting: Family Medicine

## 2019-06-30 ENCOUNTER — Other Ambulatory Visit: Payer: Self-pay | Admitting: Family Medicine

## 2019-06-30 DIAGNOSIS — I480 Paroxysmal atrial fibrillation: Secondary | ICD-10-CM

## 2019-07-03 ENCOUNTER — Other Ambulatory Visit: Payer: Self-pay | Admitting: Family Medicine

## 2019-07-09 IMAGING — NM NM MISC PROCEDURE
4 series · 24 of 24 positions shown · non-contrast
Comparison: none

[Series 1: stress · 6.40mm/px · 6 of 64 frames shown]
[frame 6/64]
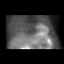
[frame 16/64]
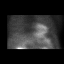
[frame 27/64]
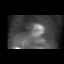
[frame 38/64]
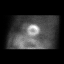
[frame 48/64]
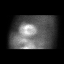
[frame 59/64]
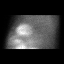

[Series 1: rest · 6.40mm/px · 6 of 64 frames shown]
[frame 6/64]
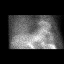
[frame 16/64]
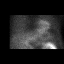
[frame 27/64]
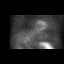
[frame 38/64]
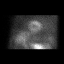
[frame 48/64]
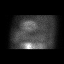
[frame 59/64]
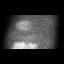

[Series 1: wbr_r-proj_st rest · 6.40mm/px · 6 of 64 frames shown]
[frame 6/64]
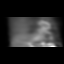
[frame 16/64]
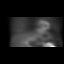
[frame 27/64]
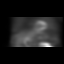
[frame 38/64]
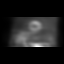
[frame 48/64]
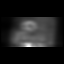
[frame 59/64]
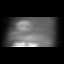

[Series 1: wbr_s-proj_st stress · 6.40mm/px · 6 of 64 frames shown]
[frame 6/64]
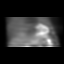
[frame 16/64]
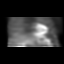
[frame 27/64]
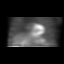
[frame 38/64]
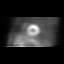
[frame 48/64]
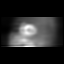
[frame 59/64]
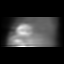

[24 of 24 positions shown; findings below may reference images not displayed]

Canned report from images found in remote index.

Refer to host system for actual result text.

## 2019-07-11 ENCOUNTER — Telehealth: Payer: Self-pay | Admitting: Family Medicine

## 2019-07-11 NOTE — Telephone Encounter (Signed)
Called pt and he think his ins is fixed as his wife is at pharmacy now. If it is not he will call me back and we will give him samples.

## 2019-07-11 NOTE — Telephone Encounter (Signed)
Ok to switch to xarelto 20 mg a day

## 2019-07-11 NOTE — Telephone Encounter (Signed)
Pt has had issues with his insurance and can not afford to pay out of pocket for the Eliquis. ?Can we temporarily place him on Xarelto as I have samples that may hold him over until he can get his insurance straight or can he just take an aspirin a day?

## 2019-08-19 ENCOUNTER — Other Ambulatory Visit: Payer: Self-pay | Admitting: Family Medicine

## 2019-09-08 ENCOUNTER — Other Ambulatory Visit: Payer: Self-pay | Admitting: Family Medicine

## 2019-09-08 NOTE — Telephone Encounter (Signed)
Ok to refill??  Last office visit 07/08/2018.  Last refill 05/26/2019, #1 refill.   Of note, letter sent to patient to schedule OV.

## 2019-09-17 ENCOUNTER — Other Ambulatory Visit: Payer: Self-pay

## 2019-09-17 ENCOUNTER — Other Ambulatory Visit: Payer: 59

## 2019-09-17 DIAGNOSIS — I4891 Unspecified atrial fibrillation: Secondary | ICD-10-CM

## 2019-09-17 DIAGNOSIS — E119 Type 2 diabetes mellitus without complications: Secondary | ICD-10-CM

## 2019-09-17 DIAGNOSIS — E7800 Pure hypercholesterolemia, unspecified: Secondary | ICD-10-CM

## 2019-09-18 LAB — COMPREHENSIVE METABOLIC PANEL
AG Ratio: 1.4 (calc) (ref 1.0–2.5)
ALT: 20 U/L (ref 9–46)
AST: 17 U/L (ref 10–35)
Albumin: 4.1 g/dL (ref 3.6–5.1)
Alkaline phosphatase (APISO): 115 U/L (ref 35–144)
BUN: 11 mg/dL (ref 7–25)
CO2: 30 mmol/L (ref 20–32)
Calcium: 9.4 mg/dL (ref 8.6–10.3)
Chloride: 102 mmol/L (ref 98–110)
Creat: 0.95 mg/dL (ref 0.70–1.33)
Globulin: 2.9 g/dL (calc) (ref 1.9–3.7)
Glucose, Bld: 137 mg/dL — ABNORMAL HIGH (ref 65–99)
Potassium: 4.7 mmol/L (ref 3.5–5.3)
Sodium: 139 mmol/L (ref 135–146)
Total Bilirubin: 0.9 mg/dL (ref 0.2–1.2)
Total Protein: 7 g/dL (ref 6.1–8.1)

## 2019-09-18 LAB — CBC WITH DIFFERENTIAL/PLATELET
Absolute Monocytes: 705 cells/uL (ref 200–950)
Basophils Absolute: 86 cells/uL (ref 0–200)
Basophils Relative: 1 %
Eosinophils Absolute: 249 cells/uL (ref 15–500)
Eosinophils Relative: 2.9 %
HCT: 49.8 % (ref 38.5–50.0)
Hemoglobin: 16.7 g/dL (ref 13.2–17.1)
Lymphs Abs: 2468 cells/uL (ref 850–3900)
MCH: 30.4 pg (ref 27.0–33.0)
MCHC: 33.5 g/dL (ref 32.0–36.0)
MCV: 90.5 fL (ref 80.0–100.0)
MPV: 10.7 fL (ref 7.5–12.5)
Monocytes Relative: 8.2 %
Neutro Abs: 5091 cells/uL (ref 1500–7800)
Neutrophils Relative %: 59.2 %
Platelets: 222 10*3/uL (ref 140–400)
RBC: 5.5 10*6/uL (ref 4.20–5.80)
RDW: 13.7 % (ref 11.0–15.0)
Total Lymphocyte: 28.7 %
WBC: 8.6 10*3/uL (ref 3.8–10.8)

## 2019-09-18 LAB — MICROALBUMIN / CREATININE URINE RATIO
Creatinine, Urine: 89 mg/dL (ref 20–320)
Microalb Creat Ratio: 7 mcg/mg creat (ref <30)
Microalb, Ur: 0.6 mg/dL

## 2019-09-18 LAB — LIPID PANEL
Cholesterol: 139 mg/dL (ref <200)
HDL: 32 mg/dL — ABNORMAL LOW (ref >=40)
LDL Cholesterol (Calc): 73 mg/dL (calc)
Non-HDL Cholesterol (Calc): 107 mg/dL (calc) (ref <130)
Total CHOL/HDL Ratio: 4.3 (calc) (ref <5.0)
Triglycerides: 244 mg/dL — ABNORMAL HIGH (ref <150)

## 2019-09-18 LAB — HEMOGLOBIN A1C
Hgb A1c MFr Bld: 7.3 % of total Hgb — ABNORMAL HIGH (ref <5.7)
Mean Plasma Glucose: 163 (calc)
eAG (mmol/L): 9 (calc)

## 2019-09-22 ENCOUNTER — Encounter: Payer: Self-pay | Admitting: Family Medicine

## 2019-09-22 ENCOUNTER — Ambulatory Visit (INDEPENDENT_AMBULATORY_CARE_PROVIDER_SITE_OTHER): Payer: 59 | Admitting: Family Medicine

## 2019-09-22 ENCOUNTER — Other Ambulatory Visit: Payer: Self-pay

## 2019-09-22 VITALS — BP 158/94 | HR 78 | Temp 97.2°F | Resp 18 | Ht 66.0 in | Wt 252.0 lb

## 2019-09-22 DIAGNOSIS — E7800 Pure hypercholesterolemia, unspecified: Secondary | ICD-10-CM

## 2019-09-22 DIAGNOSIS — I4891 Unspecified atrial fibrillation: Secondary | ICD-10-CM | POA: Diagnosis not present

## 2019-09-22 DIAGNOSIS — E119 Type 2 diabetes mellitus without complications: Secondary | ICD-10-CM | POA: Diagnosis not present

## 2019-09-22 MED ORDER — LISINOPRIL 10 MG PO TABS: 10.0000 mg | ORAL_TABLET | Freq: Every day | ORAL | 3 refills | Status: DC

## 2019-09-22 NOTE — Progress Notes (Signed)
Subjective:    Patient ID: Steven Klein, male    DOB: 16-Jul-1960, 59 y.o.   MRN: [id number]  HPI  04/02/18 Patient has a history of prediabetes.  No one has checked his blood sugar or hemoglobin A1c in more than 2 years.  He states that recently he is been experiencing urinary frequency, urinary urgency, urge incontinence.  He will urinate and then have to void again 30 minutes to an hour later.  He denies any dysuria.  However because of her urge incontinence and occasional dribbling, he is experiencing moisture that is constantly around his penis.  His foreskin will retract over the head of the penis leaving the head of the pain is wet most of the day.  As a result he has a rash on the head of the pain is consistent with Candida balanitis.  He is not checking his blood sugar.  He denies any blurry vision but he does experience polydipsia in addition to his polyuria.  He denies any recent weight loss.  I performed a prostate exam today however the patient has intense pain and anal sphincter contraction with digital rectal exam.  This prevented me from palpating the prostate to assess for BPH.  At that time, my plan was: Differential diagnosis includes BPH, uncontrolled diabetes causing polyuria, overactive bladder.  I am unable to palpate his prostate today due to pain with rectal exam and spasm of the anal sphincter.  Therefore I will check a PSA.  If PSA is not elevated, I will treat the patient is overactive bladder.  I will also check hemoglobin A1c to ensure that the polyuria is not due to hyperglycemia.  I will treat the Candida balanitis with Diflucan 150 mg p.o. x1 followed by nystatin cream applied 3 times a day.  I encouraged the patient to try to keep the head of the penis dry as much as possible.  If the patient's PSA is elevated, we may need to treat the patient for BPH instead of overactive bladder.  04/05/18  Patient is here today to discuss his lab work.  He admits to drinking a  sixpack of sodas every day in addition to sweet tea.  He also eats a diet high in carbohydrates including a lot of fast food, bread, potatoes etc.  He is not getting regular exercise.  We started the patient on glipizide and metformin as documented below.  He has drastically reduced his consumption of sodas and is only drinking water.  He is avoiding as many carbohydrates as possible.  He states that his fasting blood sugars have been around 160.  His random blood sugars have been under 200.  He denies any chest pain or shortness of breath.  He states that his polyuria has already improved.  The balanitis is also improving.  At that time, my plan was: I spent 30 minutes today with the patient and his wife discussing his diagnosis.  He will check his fasting blood sugars and 2-hour postprandial sugars over the next month and notify me of the values.  Goal fasting blood sugars between 80 and 130.  Goal 2-hour postprandial sugars are between 80 and 160.  Recheck hemoglobin A1c in 3 months.  At that time I will repeat a fasting lipid panel.  Goal LDL is less than 100.  We will also check a urine microalbumin at that time to screen for diabetic nephropathy.  Blood pressure today is adequately controlled at 130/80.  I recommended a low carbohydrate  diet.  I recommended less than 45 g of carbs per meal.  I also recommended that we monitor closely for hypoglycemia given the fact he is on glipizide.  07/08/18 Here for follow up. Patient quit glipizide due to blurry vision.  However on metformin alone, the patient's fasting blood sugars are between 80 and 120.  His 2-hour postprandial sugars are between 101 140.  He seldom has any hypoglycemic episodes.  He has not had any sugars greater than 200 in over 2 months.  He denies any polyuria, polydipsia, or blurry vision.  He denies any chest pain shortness of breath or dyspnea on exertion.  He denies any myalgias or right upper quadrant pain.  He has had his diabetic eye exam  and it was normal.  At that time, my plan was: Metformin alone seems to be controlling his blood sugars now that he is changed his diet dramatically.  He stopped all sodas and has drastically limited his carbs and lost some weight.  I will check hemoglobin A1c.  Goal hemoglobin A1c is less than 7.  Blood pressure today is slightly elevated however he has whitecoat syndrome.  I have asked him to check his blood pressure every day at home for the next week and notify me of the values.  If greater than 140/90 we will titrate his medication accordingly.  Also check a fasting lipid panel.  Goal LDL is less than 100  09/22/19 Has not been seen since that time.   Lab on 09/17/2019  Component Date Value Ref Range Status  . Hgb A1c MFr Bld 09/17/2019 7.3* <5.7 % of total Hgb Final   Comment: For someone without known diabetes, a hemoglobin A1c value of 6.5% or greater indicates that they may have  diabetes and this should be confirmed with a follow-up  test. . For someone with known diabetes, a value <7% indicates  that their diabetes is well controlled and a value  greater than or equal to 7% indicates suboptimal  control. A1c targets should be individualized based on  duration of diabetes, age, comorbid conditions, and  other considerations. . Currently, no consensus exists regarding use of hemoglobin A1c for diagnosis of diabetes for children. .   . Mean Plasma Glucose 09/17/2019 163  (calc) Final  . eAG (mmol/L) 09/17/2019 9.0  (calc) Final  . Glucose, Bld 09/17/2019 137* 65 - 99 mg/dL Final   Comment: .            Fasting reference interval . For someone without known diabetes, a glucose value >125 mg/dL indicates that they may have diabetes and this should be confirmed with a follow-up test. .   . BUN 09/17/2019 11  7 - 25 mg/dL Final  . Creat 09/17/2019 0.95  0.70 - 1.33 mg/dL Final   Comment: For patients >48 years of age, the reference limit for Creatinine is approximately 13%  higher for people identified as African-American. .   Havery Moros Ratio 69/48/5462 NOT APPLICABLE  6 - 22 (calc) Final  . Sodium 09/17/2019 139  135 - 146 mmol/L Final  . Potassium 09/17/2019 4.7  3.5 - 5.3 mmol/L Final  . Chloride 09/17/2019 102  98 - 110 mmol/L Final  . CO2 09/17/2019 30  20 - 32 mmol/L Final  . Calcium 09/17/2019 9.4  8.6 - 10.3 mg/dL Final  . Total Protein 09/17/2019 7.0  6.1 - 8.1 g/dL Final  . Albumin 09/17/2019 4.1  3.6 - 5.1 g/dL Final  . Globulin 09/17/2019  2.9  1.9 - 3.7 g/dL (calc) Final  . AG Ratio 09/17/2019 1.4  1.0 - 2.5 (calc) Final  . Total Bilirubin 09/17/2019 0.9  0.2 - 1.2 mg/dL Final  . Alkaline phosphatase (APISO) 09/17/2019 115  35 - 144 U/L Final  . AST 09/17/2019 17  10 - 35 U/L Final  . ALT 09/17/2019 20  9 - 46 U/L Final  . WBC 09/17/2019 8.6  3.8 - 10.8 Thousand/uL Final  . RBC 09/17/2019 5.50  4.20 - 5.80 Million/uL Final  . Hemoglobin 09/17/2019 16.7  13.2 - 17.1 g/dL Final  . HCT 09/17/2019 49.8  38.5 - 50.0 % Final  . MCV 09/17/2019 90.5  80.0 - 100.0 fL Final  . MCH 09/17/2019 30.4  27.0 - 33.0 pg Final  . MCHC 09/17/2019 33.5  32.0 - 36.0 g/dL Final  . RDW 09/17/2019 13.7  11.0 - 15.0 % Final  . Platelets 09/17/2019 222  140 - 400 Thousand/uL Final  . MPV 09/17/2019 10.7  7.5 - 12.5 fL Final  . Neutro Abs 09/17/2019 5,091  1,500 - 7,800 cells/uL Final  . Lymphs Abs 09/17/2019 2,468  850 - 3,900 cells/uL Final  . Absolute Monocytes 09/17/2019 705  200 - 950 cells/uL Final  . Eosinophils Absolute 09/17/2019 249  15 - 500 cells/uL Final  . Basophils Absolute 09/17/2019 86  0 - 200 cells/uL Final  . Neutrophils Relative % 09/17/2019 59.2  % Final  . Total Lymphocyte 09/17/2019 28.7  % Final  . Monocytes Relative 09/17/2019 8.2  % Final  . Eosinophils Relative 09/17/2019 2.9  % Final  . Basophils Relative 09/17/2019 1.0  % Final  . Cholesterol 09/17/2019 139  <200 mg/dL Final  . HDL 09/17/2019 32* > OR = 40 mg/dL Final  .  Triglycerides 09/17/2019 244* <150 mg/dL Final   Comment: . If a non-fasting specimen was collected, consider repeat triglyceride testing on a fasting specimen if clinically indicated.  Yates Decamp et al. J. of Clin. Lipidol. 2505;3:976-734. .   . LDL Cholesterol (Calc) 09/17/2019 73  mg/dL (calc) Final   Comment: Reference range: <100 . Desirable range <100 mg/dL for primary prevention;   <70 mg/dL for patients with CHD or diabetic patients  with > or = 2 CHD risk factors. Marland Kitchen LDL-C is now calculated using the Martin-Hopkins  calculation, which is a validated novel method providing  better accuracy than the Friedewald equation in the  estimation of LDL-C.  Cresenciano Genre et al. Annamaria Helling. 1937;902(40): 2061-2068  (http://education.[facility].com/faq/FAQ164)   . Total CHOL/HDL Ratio 09/17/2019 4.3  <5.0 (calc) Final  . Non-HDL Cholesterol (Calc) 09/17/2019 107  <130 mg/dL (calc) Final   Comment: For patients with diabetes plus 1 major ASCVD risk  factor, treating to a non-HDL-C goal of <100 mg/dL  (LDL-C of <70 mg/dL) is considered a therapeutic  option.   . Creatinine, Urine 09/17/2019 89  20 - 320 mg/dL Final  . Microalb, Ur 09/17/2019 0.6  mg/dL Final   Comment: Reference Range Not established   . Microalb Creat Ratio 09/17/2019 7  <30 mcg/mg creat Final   Comment: . The ADA defines abnormalities in albumin excretion as follows: Marland Kitchen Category         Result (mcg/mg creatinine) . Normal                    <30 Microalbuminuria         30-299  Clinical albuminuria   > OR = 300 . The ADA recommends that  at least two of three specimens collected within a 3-6 month period be abnormal before considering a patient to be within a diagnostic category.    Patient presents today for a checkup.  He is a very pleasant 59 year old Caucasian male with a history of type 2 diabetes mellitus, hypertension, atrial fibrillation.  He is due for pneumonia shot.  However he has just received the  Covid vaccination and is scheduled to receive the second dose anytime in the next week or 2.  Therefore I recommended that he not receive the pneumonia vaccine until after he is received his second Covid vaccination.  But I did encourage him to get Pneumovax 23 at his earliest convenience.  He is also overdue for colonoscopy however he is already scheduled this himself.  He is due for diabetic foot exam.  He denies any neuropathy in his feet.  He denies any pain in his feet other than the chronic pain in his left ankle from his fracture.  His blood pressure today is elevated however he has a history of whitecoat syndrome.  He is not on an ACE inhibitor for renal protection.  His most recent lab work is listed above.  He is only taking Metformin 500 mg once a day due to diarrhea and fecal incontinence.  He denies any polyuria, polydipsia, or blurry vision.  He denies any chest pain shortness of breath or dyspnea on exertion.  Unfortunately continues to smoke. Past Medical History:  Diagnosis Date  . Adenomatous colon polyp   . Anxiety and depression   . Atrial fibrillation ([place])   . Cardiomegaly   . Depression   . Diabetes mellitus without complication ([employer])   . Hyperlipidemia   . Metabolic syndrome X   . Panic attack    Past Surgical History:  Procedure Laterality Date  . COLONOSCOPY W/ POLYPECTOMY    . ORIF ANKLE FRACTURE     Current Outpatient Medications on File Prior to Visit  Medication Sig Dispense Refill  . ALPRAZolam (XANAX) 1 MG tablet TAKE 1/2 TABLET BY MOUTH EVERY 8 HOURS AS NEEDED 30 tablet 0  . blood glucose meter kit and supplies Dispense based on patient and insurance preference. Use up to 3 times as directed. (FOR ICD-10 E10.9, E11.9). 1 each 0  . Blood Glucose Monitoring Suppl (ACCU-CHEK GUIDE ME) w/Device KIT 1 strip by Does not apply route 2 (two) times daily. (Check BS BID - DX E11.9) 1 kit 2  . cetirizine (ZYRTEC) 10 MG tablet Take 10 mg by mouth daily.    Marland Kitchen ELIQUIS 5 MG  TABS tablet TAKE 1 TABLET BY MOUTH TWICE A DAY 60 tablet 5  . glucose blood (ACCU-CHEK GUIDE) test strip Check BS BID DX: E11.9 100 each 3  . Lancets MISC Please dispense based on patient and insurance preference. Use as directed to monitor FSBS 2x daily. Dx: E11.9 100 each 3  . metFORMIN (GLUCOPHAGE-XR) 500 MG 24 hr tablet TAKE 2 TABLETS BY MOUTH EVERY DAY WITH BREAKFAST 180 tablet 0  . metoprolol succinate (TOPROL-XL) 50 MG 24 hr tablet TAKE 1 TABLET BY MOUTH ONCE A DAY 90 tablet 3  . PARoxetine (PAXIL) 30 MG tablet TAKE 1 TABLET BY MOUTH EVERY DAY 90 tablet 3  . simvastatin (ZOCOR) 40 MG tablet TAKE 1 TABLET BY MOUTH EVERYDAY AT BEDTIME 90 tablet 1   No current facility-administered medications on file prior to visit.   Allergies  Allergen Reactions  . Sulfamethoxazole     Tongue swells  .  Ezetimibe-Simvastatin     Does not remember  . Sulfonamide Derivatives     REACTION: breathing problems   Social History   Socioeconomic History  . Marital status: Married    Spouse name: Not on file  . Number of children: 1  . Years of education: Not on file  . Highest education level: Not on file  Occupational History  . Occupation: OWNER    Employer: [employer]    Comment: lawn care  Tobacco Use  . Smoking status: Current Every Day Smoker  . Smokeless tobacco: Never Used  Substance and Sexual Activity  . Alcohol use: No    Alcohol/week: 0.0 standard drinks  . Drug use: No  . Sexual activity: Not on file  Other Topics Concern  . Not on file  Social History Narrative  . Not on file   Social Determinants of Health   Financial Resource Strain:   . Difficulty of Paying Living Expenses:   Food Insecurity:   . Worried About Charity fundraiser in the Last Year:   . Arboriculturist in the Last Year:   Transportation Needs:   . Film/video editor (Medical):   Marland Kitchen Lack of Transportation (Non-Medical):   Physical Activity:   . Days of Exercise per Week:   . Minutes of  Exercise per Session:   Stress:   . Feeling of Stress :   Social Connections:   . Frequency of Communication with Friends and Family:   . Frequency of Social Gatherings with Friends and Family:   . Attends Religious Services:   . Active Member of Clubs or Organizations:   . Attends Archivist Meetings:   Marland Kitchen Marital Status:   Intimate Partner Violence:   . Fear of Current or Ex-Partner:   . Emotionally Abused:   Marland Kitchen Physically Abused:   . Sexually Abused:      Review of Systems  All other systems reviewed and are negative.      Objective:   Physical Exam Vitals reviewed.  Constitutional:      General: He is not in acute distress.    Appearance: He is well-developed. He is not diaphoretic.  HENT:     Right Ear: External ear normal.     Left Ear: External ear normal.     Nose: Nose normal.     Mouth/Throat:     Pharynx: No oropharyngeal exudate.  Eyes:     Conjunctiva/sclera: Conjunctivae normal.  Neck:     Thyroid: No thyromegaly.  Cardiovascular:     Rate and Rhythm: Normal rate and regular rhythm.     Heart sounds: Normal heart sounds. No murmur. No friction rub. No gallop.   Pulmonary:     Effort: Pulmonary effort is normal. No respiratory distress.     Breath sounds: Normal breath sounds. No wheezing or rales.  Abdominal:     General: Bowel sounds are normal. There is no distension.     Palpations: Abdomen is soft. There is no mass.     Tenderness: There is no abdominal tenderness. There is no guarding or rebound.  Musculoskeletal:     Cervical back: Neck supple.  Lymphadenopathy:     Cervical: No cervical adenopathy.  Neurological:     Mental Status: He is alert and oriented to person, place, and time.     Cranial Nerves: No cranial nerve deficit.     Motor: No abnormal muscle tone.     Coordination: Coordination normal.  Deep Tendon Reflexes: Reflexes are normal and symmetric.           Assessment & Plan:   New onset type 2 diabetes  mellitus ([place])  Pure hypercholesterolemia  Atrial fibrillation, unspecified type ([place])  Strongly recommended smoking cessation.  Recommend the patient get his colonoscopy scheduled and performed.  Patient declines hepatitis C and HIV screening.  Recommended we increase his Metformin to 750 mg twice a day and then recheck hemoglobin A1c in 3 months.  Add lisinopril 10 mg a day for his elevated blood pressure as well as for renal protection.  Recommended Pneumovax 23 after he receives his second Covid vaccination.

## 2019-12-05 ENCOUNTER — Other Ambulatory Visit: Payer: Self-pay | Admitting: Family Medicine

## 2019-12-09 ENCOUNTER — Other Ambulatory Visit: Payer: Self-pay

## 2019-12-09 DIAGNOSIS — I480 Paroxysmal atrial fibrillation: Secondary | ICD-10-CM

## 2019-12-09 MED ORDER — APIXABAN 5 MG PO TABS: 5.0000 mg | ORAL_TABLET | Freq: Two times a day (BID) | ORAL | 5 refills | Status: DC

## 2019-12-18 ENCOUNTER — Other Ambulatory Visit: Payer: Self-pay | Admitting: Family Medicine

## 2019-12-18 NOTE — Telephone Encounter (Signed)
Requested Prescriptions   Pending Prescriptions Disp Refills  . ALPRAZolam (XANAX) 1 MG tablet [Pharmacy Med Name: ALPRAZOLAM 1 MG TABLET] 30 tablet 0    Sig: TAKE 1/2 TABLET BY MOUTH EVERY 8 HOURS AS NEEDED     Last OV 09/22/2019   Last ordered 09/08/2019

## 2020-02-13 ENCOUNTER — Other Ambulatory Visit: Payer: Self-pay | Admitting: Family Medicine

## 2020-02-13 NOTE — Telephone Encounter (Signed)
Ok to refill??  Last office visit 09/22/2019.  Last refill 12/19/2019.

## 2020-03-19 ENCOUNTER — Ambulatory Visit (INDEPENDENT_AMBULATORY_CARE_PROVIDER_SITE_OTHER): Payer: 59 | Admitting: Family Medicine

## 2020-03-19 ENCOUNTER — Other Ambulatory Visit: Payer: Self-pay

## 2020-03-19 VITALS — BP 130/80 | HR 65 | Temp 97.5°F | Ht 66.0 in | Wt 245.0 lb

## 2020-03-19 DIAGNOSIS — B372 Candidiasis of skin and nail: Secondary | ICD-10-CM

## 2020-03-19 MED ORDER — FLUCONAZOLE 150 MG PO TABS: 150.0000 mg | ORAL_TABLET | Freq: Once | ORAL | 0 refills | Status: AC

## 2020-03-19 MED ORDER — CLOTRIMAZOLE-BETAMETHASONE 1-0.05 % EX CREA: 1.0000 "application " | TOPICAL_CREAM | Freq: Two times a day (BID) | CUTANEOUS | 0 refills | Status: DC

## 2020-03-19 NOTE — Progress Notes (Signed)
Subjective:    Patient ID: Steven Klein, male    DOB: 1960/11/18, 58 y.o.   MRN: [id number]  HPI  Patient has a history of type 2 diabetes.  He is overdue for fasting lab work.  He would like to return next week fasting so he can get his A1c and fasting lipid panel.  However he presents today with a painful rash.  There is an erythematous patch of skin on either side of his scrotum spreading onto the adjacent upper medial thighs bilaterally.  There is satellite erythematous patches surrounding that.  It has a linear shape orientation similar to Candida intertrigo.  It itches and burns.  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 Past Medical History:  Diagnosis Date  . Adenomatous colon polyp   . Anxiety and depression   . Atrial fibrillation ([place])   . Cardiomegaly   . Depression   . Diabetes mellitus without complication ([place])   . Hyperlipidemia   . Metabolic syndrome X   . Panic attack    Past Surgical History:  Procedure Laterality Date  . COLONOSCOPY W/ POLYPECTOMY    . ORIF ANKLE FRACTURE     Current Outpatient Medications on File Prior to Visit  Medication Sig Dispense Refill  . ALPRAZolam (XANAX) 1 MG tablet TAKE 1/2 TABLET BY MOUTH EVERY 8 HOURS AS NEEDED 30 tablet 0  . apixaban (ELIQUIS) 5 MG TABS tablet Take 1 tablet (5 mg total) by mouth 2 (two) times daily. 60 tablet 5  . blood glucose meter kit and supplies Dispense based on patient and insurance preference. Use up to 3 times as directed. (FOR ICD-10 E10.9, E11.9). 1 each 0  . Blood Glucose Monitoring Suppl (ACCU-CHEK GUIDE ME) w/Device KIT 1 strip by Does not apply route 2 (two) times daily. (Check BS BID - DX E11.9) 1 kit 2  . cetirizine (ZYRTEC) 10 MG tablet Take 10 mg by mouth daily.    Marland Kitchen glucose blood (ACCU-CHEK GUIDE) test strip Check BS BID DX: E11.9 100 each 3  . Lancets MISC Please dispense based on patient and insurance preference. Use as directed to monitor FSBS 2x  daily. Dx: E11.9 100 each 3  . lisinopril (ZESTRIL) 10 MG tablet Take 1 tablet (10 mg total) by mouth daily. 90 tablet 3  . metFORMIN (GLUCOPHAGE-XR) 500 MG 24 hr tablet TAKE 2 TABLETS BY MOUTH EVERY DAY WITH BREAKFAST (Patient taking differently: 500 mg daily with breakfast. ) 180 tablet 0  . metoprolol succinate (TOPROL-XL) 50 MG 24 hr tablet TAKE 1 TABLET BY MOUTH ONCE A DAY 90 tablet 3  . PARoxetine (PAXIL) 30 MG tablet TAKE 1 TABLET BY MOUTH EVERY DAY 90 tablet 3  . simvastatin (ZOCOR) 40 MG tablet TAKE 1 TABLET BY MOUTH EVERYDAY AT BEDTIME 90 tablet 1   No current facility-administered medications on file prior to visit.   Allergies  Allergen Reactions  . Sulfamethoxazole     Tongue swells  . Ezetimibe-Simvastatin     Does not remember  . Sulfonamide Derivatives     REACTION: breathing problems   Social History   Socioeconomic History  . Marital status: Married    Spouse name: Not on file  . Number of children: 1  . Years of education: Not on file  . Highest education level: Not on file  Occupational History  . Occupation: OWNER    Employer: [employer]    Comment: lawn care  Tobacco Use  . Smoking status: Current Every Day Smoker  . Smokeless tobacco: Never Used  Substance and Sexual Activity  . Alcohol use: No    Alcohol/week: 0.0 standard drinks  . Drug use: No  . Sexual activity: Not on file  Other Topics Concern  . Not on file  Social History Narrative  . Not on file   Social Determinants of Health   Financial Resource Strain:   . Difficulty of Paying Living Expenses: Not on file  Food Insecurity:   . Worried About Charity fundraiser in the Last Year: Not on file  . Ran Out of Food in the Last Year: Not on file  Transportation Needs:   . Lack of Transportation (Medical): Not on file  . Lack of Transportation (Non-Medical): Not on file  Physical Activity:   . Days of Exercise per Week: Not on file  . Minutes of Exercise per Session: Not on  file  Stress:   . Feeling of Stress : Not on file  Social Connections:   . Frequency of Communication with Friends and Family: Not on file  . Frequency of Social Gatherings with Friends and Family: Not on file  . Attends Religious Services: Not on file  . Active Member of Clubs or Organizations: Not on file  . Attends Archivist Meetings: Not on file  . Marital Status: Not on file  Intimate Partner Violence:   . Fear of Current or Ex-Partner: Not on file  . Emotionally Abused: Not on file  . Physically Abused: Not on file  . Sexually Abused: Not on file     Review of Systems  Skin: Positive for rash.  All other systems reviewed and are negative.      Objective:   Physical Exam Vitals reviewed.  Constitutional:      General: He is not in acute distress.    Appearance: He is well-developed. He is not diaphoretic.  HENT:     Right Ear: External ear normal.     Left Ear: External ear normal.     Nose: Nose normal.     Mouth/Throat:     Pharynx: No oropharyngeal exudate.  Eyes:     Conjunctiva/sclera: Conjunctivae normal.  Neck:     Thyroid: No thyromegaly.  Cardiovascular:     Rate and Rhythm: Normal rate and regular rhythm.     Heart sounds: Normal heart sounds. No murmur heard.  No friction rub. No gallop.   Pulmonary:     Effort: Pulmonary effort is normal. No respiratory distress.     Breath sounds: Normal breath sounds. No wheezing or rales.  Abdominal:     General: Bowel sounds are normal. There is no distension.     Palpations: Abdomen is soft. There is no mass.     Tenderness: There is no abdominal tenderness. There is no guarding or rebound.  Genitourinary:   Musculoskeletal:     Cervical back: Neck supple.  Lymphadenopathy:     Cervical: No cervical adenopathy.  Neurological:     Mental Status: He is alert and oriented to person, place, and time.     Cranial Nerves: No cranial nerve deficit.     Motor: No abnormal muscle tone.      Coordination: Coordination normal.     Deep Tendon Reflexes: Reflexes are normal and symmetric.           Assessment & Plan:   Candidal intertrigo  Patient appears to have Candida intertrigo moderate  in severity.  Recommended Diflucan 150 mg p.o. x1 and then Lotrisone be applied twice daily for 2 weeks.  Recommend that he keep the areas dry as possible and discussed preventative strategies for in the future.  Recheck if no better next week

## 2020-04-11 ENCOUNTER — Other Ambulatory Visit: Payer: Self-pay | Admitting: Family Medicine

## 2020-04-12 NOTE — Telephone Encounter (Signed)
Refill Alprazolam  Please Advise

## 2020-05-29 ENCOUNTER — Other Ambulatory Visit: Payer: Self-pay | Admitting: Family Medicine

## 2020-05-30 ENCOUNTER — Other Ambulatory Visit: Payer: Self-pay | Admitting: Family Medicine

## 2020-06-01 NOTE — Telephone Encounter (Signed)
Ok to refill??  Last office visit 03/19/2020.  Last refill 04/12/2020.

## 2020-07-02 ENCOUNTER — Other Ambulatory Visit: Payer: Self-pay | Admitting: Family Medicine

## 2020-07-02 DIAGNOSIS — I480 Paroxysmal atrial fibrillation: Secondary | ICD-10-CM

## 2020-08-16 ENCOUNTER — Other Ambulatory Visit: Payer: Self-pay | Admitting: Family Medicine

## 2020-08-16 ENCOUNTER — Telehealth: Payer: Self-pay | Admitting: Family Medicine

## 2020-08-16 ENCOUNTER — Other Ambulatory Visit: Payer: Self-pay

## 2020-08-16 MED ORDER — PAROXETINE HCL 30 MG PO TABS: 30.0000 mg | ORAL_TABLET | Freq: Every day | ORAL | 3 refills | Status: DC

## 2020-08-16 NOTE — Telephone Encounter (Signed)
Received fax from pharmacy requesting a refill on PARoxetine (PAXIL) 30 MG tablet

## 2020-08-30 ENCOUNTER — Other Ambulatory Visit: Payer: Self-pay | Admitting: Family Medicine

## 2020-10-14 ENCOUNTER — Other Ambulatory Visit: Payer: Self-pay | Admitting: *Deleted

## 2020-10-14 NOTE — Telephone Encounter (Signed)
Received fax requesting refill on Xanax.   Ok to refill??  Last office visit 03/19/2020.  Last refill 08/16/2020.  Of note, letter sent to patient to schedule OV.

## 2020-10-15 MED ORDER — ALPRAZOLAM 1 MG PO TABS: ORAL_TABLET | ORAL | 0 refills | Status: DC

## 2020-10-25 ENCOUNTER — Other Ambulatory Visit: Payer: Self-pay

## 2020-10-25 ENCOUNTER — Ambulatory Visit (INDEPENDENT_AMBULATORY_CARE_PROVIDER_SITE_OTHER): Payer: 59 | Admitting: Family Medicine

## 2020-10-25 VITALS — BP 130/80 | HR 94 | Temp 98.3°F | Ht 66.0 in | Wt 244.0 lb

## 2020-10-25 DIAGNOSIS — E7800 Pure hypercholesterolemia, unspecified: Secondary | ICD-10-CM | POA: Diagnosis not present

## 2020-10-25 DIAGNOSIS — Z23 Encounter for immunization: Secondary | ICD-10-CM | POA: Diagnosis not present

## 2020-10-25 DIAGNOSIS — E118 Type 2 diabetes mellitus with unspecified complications: Secondary | ICD-10-CM | POA: Diagnosis not present

## 2020-10-25 DIAGNOSIS — I4891 Unspecified atrial fibrillation: Secondary | ICD-10-CM

## 2020-10-25 MED ORDER — METFORMIN HCL ER 500 MG PO TB24: ORAL_TABLET | ORAL | 0 refills | Status: DC

## 2020-10-25 MED ORDER — ALPRAZOLAM 1 MG PO TABS: ORAL_TABLET | ORAL | 0 refills | Status: DC

## 2020-10-25 MED ORDER — SIMVASTATIN 40 MG PO TABS: 40.0000 mg | ORAL_TABLET | Freq: Every day | ORAL | 1 refills | Status: DC

## 2020-10-25 MED ORDER — LISINOPRIL 10 MG PO TABS: 10.0000 mg | ORAL_TABLET | Freq: Every day | ORAL | 3 refills | Status: DC

## 2020-10-25 NOTE — Progress Notes (Signed)
Subjective:    Patient ID: Steven Klein, male    DOB: October 13, 1960, 60 y.o.   MRN: [id number]  HPI  Patient is here today for a checkup.  He denies any chest pain shortness of breath or dyspnea on exertion.  He is in atrial fibrillation today but his rate is controlled.  He is appropriately taking his anticoagulant Eliquis twice daily.  He denies any diarrhea on metformin.  He states that his fasting blood sugars are between 140 and 150 and his 2-hour postprandial sugars are around 150.  He denies any hypoglycemic episodes.  He denies any tachyarrhythmias.  He can still occasionally have panic attacks which she takes Xanax.  Otherwise he is doing well.  He is due for colon cancer screening.  He is also due for lung cancer screening however he declines both of these today.  He states that he is too busy at work right now and he will call me back when he wants me to schedule him.  He is also due for prostate cancer screening along with fasting lab work.  He denies any polyuria, polydipsia, or blurry vision.  Past Medical History:  Diagnosis Date  . Adenomatous colon polyp   . Anxiety and depression   . Atrial fibrillation ([place])   . Cardiomegaly   . Depression   . Diabetes mellitus without complication ([place])   . Hyperlipidemia   . Metabolic syndrome X   . Panic attack    Past Surgical History:  Procedure Laterality Date  . COLONOSCOPY W/ POLYPECTOMY    . ORIF ANKLE FRACTURE     Current Outpatient Medications on File Prior to Visit  Medication Sig Dispense Refill  . ALPRAZolam (XANAX) 1 MG tablet TAKE 1/2 TABLET BY MOUTH EVERY 8 HOURS AS NEEDED 30 tablet 0  . blood glucose meter kit and supplies Dispense based on patient and insurance preference. Use up to 3 times as directed. (FOR ICD-10 E10.9, E11.9). 1 each 0  . Blood Glucose Monitoring Suppl (ACCU-CHEK GUIDE ME) w/Device KIT 1 strip by Does not apply route 2 (two) times daily. (Check BS BID - DX E11.9) 1 kit 2  . cetirizine (ZYRTEC)  10 MG tablet Take 10 mg by mouth daily.    . clotrimazole-betamethasone (LOTRISONE) cream Apply 1 application topically 2 (two) times daily. 30 g 0  . ELIQUIS 5 MG TABS tablet TAKE 1 TABLET BY MOUTH TWICE A DAY 60 tablet 5  . glucose blood (ACCU-CHEK GUIDE) test strip Check BS BID DX: E11.9 100 each 3  . Lancets MISC Please dispense based on patient and insurance preference. Use as directed to monitor FSBS 2x daily. Dx: E11.9 100 each 3  . lisinopril (ZESTRIL) 10 MG tablet Take 1 tablet (10 mg total) by mouth daily. 90 tablet 3  . metFORMIN (GLUCOPHAGE-XR) 500 MG 24 hr tablet TAKE 2 TABLETS BY MOUTH EVERY DAY WITH BREAKFAST 180 tablet 0  . metoprolol succinate (TOPROL-XL) 50 MG 24 hr tablet TAKE 1 TABLET BY MOUTH ONCE A DAY 90 tablet 3  . PARoxetine (PAXIL) 30 MG tablet Take 1 tablet (30 mg total) by mouth daily. 90 tablet 3  . simvastatin (ZOCOR) 40 MG tablet TAKE 1 TABLET BY MOUTH EVERYDAY AT BEDTIME 90 tablet 1   No current facility-administered medications on file prior to visit.   Allergies  Allergen Reactions  . Sulfamethoxazole     Tongue swells  . Ezetimibe-Simvastatin     Does not remember  . Sulfonamide Derivatives  REACTION: breathing problems   Social History   Socioeconomic History  . Marital status: Married    Spouse name: Not on file  . Number of children: 1  . Years of education: Not on file  . Highest education level: Not on file  Occupational History  . Occupation: OWNER    Employer: [employer]    Comment: lawn care  Tobacco Use  . Smoking status: Current Every Day Smoker  . Smokeless tobacco: Never Used  Substance and Sexual Activity  . Alcohol use: No    Alcohol/week: 0.0 standard drinks  . Drug use: No  . Sexual activity: Not on file  Other Topics Concern  . Not on file  Social History Narrative  . Not on file   Social Determinants of Health   Financial Resource Strain: Not on file  Food Insecurity: Not on file  Transportation  Needs: Not on file  Physical Activity: Not on file  Stress: Not on file  Social Connections: Not on file  Intimate Partner Violence: Not on file     Review of Systems  All other systems reviewed and are negative.      Objective:   Physical Exam Vitals reviewed.  Constitutional:      General: He is not in acute distress.    Appearance: He is well-developed. He is not diaphoretic.  HENT:     Right Ear: External ear normal.     Left Ear: External ear normal.     Nose: Nose normal.     Mouth/Throat:     Pharynx: No oropharyngeal exudate.  Eyes:     Conjunctiva/sclera: Conjunctivae normal.  Neck:     Thyroid: No thyromegaly.  Cardiovascular:     Rate and Rhythm: Normal rate. Rhythm irregular.     Heart sounds: Normal heart sounds. No murmur heard. No friction rub. No gallop.   Pulmonary:     Effort: Pulmonary effort is normal. No respiratory distress.     Breath sounds: Normal breath sounds. No wheezing or rales.  Abdominal:     General: Bowel sounds are normal. There is no distension.     Palpations: Abdomen is soft. There is no mass.     Tenderness: There is no abdominal tenderness. There is no guarding or rebound.  Musculoskeletal:     Cervical back: Neck supple.  Lymphadenopathy:     Cervical: No cervical adenopathy.  Neurological:     Mental Status: He is alert and oriented to person, place, and time.     Cranial Nerves: No cranial nerve deficit.     Motor: No abnormal muscle tone.     Coordination: Coordination normal.     Deep Tendon Reflexes: Reflexes are normal and symmetric.           Assessment & Plan:   Atrial fibrillation, unspecified type ([place])  Controlled type 2 diabetes mellitus with complication, without long-term current use of insulin (HCC) - Plan: Hemoglobin A1c, CBC with Differential/Platelet, COMPLETE METABOLIC PANEL WITH GFR, Lipid panel, Microalbumin, urine  Pure hypercholesterolemia  Need for pneumococcal vaccination - Plan:  Pneumococcal polysaccharide vaccine 23-valent greater than or equal to 2yo subcutaneous/IM  Blood pressure today is acceptable.  Check CBC, CMP, fasting lipid panel, and A1c.  Goal A1c is less than 6.5.  Check a urine microalbumin.  Goal albumin to creatinine ratio is less than 30.  Check an LDL cholesterol.  Goal LDL cholesterol is less than 100.  Patient received Pneumovax 23.  Recommended smoking cessation  strongly.  Recommended a CT scan to screen for lung cancer.  Recommended a colonoscopy.  He declines both of these cancer screening test at the present time but states that he will call me back when he changes his mind.

## 2020-10-26 LAB — LIPID PANEL
Cholesterol: 141 mg/dL (ref <200)
HDL: 33 mg/dL — ABNORMAL LOW (ref >=40)
LDL Cholesterol (Calc): 75 mg/dL (calc)
Non-HDL Cholesterol (Calc): 108 mg/dL (calc) (ref <130)
Total CHOL/HDL Ratio: 4.3 (calc) (ref <5.0)
Triglycerides: 240 mg/dL — ABNORMAL HIGH (ref <150)

## 2020-10-26 LAB — CBC WITH DIFFERENTIAL/PLATELET
Absolute Monocytes: 617 cells/uL (ref 200–950)
Basophils Absolute: 88 cells/uL (ref 0–200)
Basophils Relative: 0.9 %
Eosinophils Absolute: 186 cells/uL (ref 15–500)
Eosinophils Relative: 1.9 %
HCT: 50.9 % — ABNORMAL HIGH (ref 38.5–50.0)
Hemoglobin: 16.4 g/dL (ref 13.2–17.1)
Lymphs Abs: 2558 cells/uL (ref 850–3900)
MCH: 28.8 pg (ref 27.0–33.0)
MCHC: 32.2 g/dL (ref 32.0–36.0)
MCV: 89.3 fL (ref 80.0–100.0)
MPV: 10.7 fL (ref 7.5–12.5)
Monocytes Relative: 6.3 %
Neutro Abs: 6350 cells/uL (ref 1500–7800)
Neutrophils Relative %: 64.8 %
Platelets: 250 10*3/uL (ref 140–400)
RBC: 5.7 10*6/uL (ref 4.20–5.80)
RDW: 13.1 % (ref 11.0–15.0)
Total Lymphocyte: 26.1 %
WBC: 9.8 10*3/uL (ref 3.8–10.8)

## 2020-10-26 LAB — COMPLETE METABOLIC PANEL WITH GFR
AG Ratio: 1.6 (calc) (ref 1.0–2.5)
ALT: 18 U/L (ref 9–46)
AST: 16 U/L (ref 10–35)
Albumin: 4.1 g/dL (ref 3.6–5.1)
Alkaline phosphatase (APISO): 102 U/L (ref 35–144)
BUN: 10 mg/dL (ref 7–25)
CO2: 29 mmol/L (ref 20–32)
Calcium: 9.3 mg/dL (ref 8.6–10.3)
Chloride: 101 mmol/L (ref 98–110)
Creat: 0.9 mg/dL (ref 0.70–1.25)
GFR, Est African American: 107 mL/min/{1.73_m2} (ref >=60)
GFR, Est Non African American: 93 mL/min/{1.73_m2} (ref >=60)
Globulin: 2.6 g/dL (calc) (ref 1.9–3.7)
Glucose, Bld: 149 mg/dL — ABNORMAL HIGH (ref 65–99)
Potassium: 4 mmol/L (ref 3.5–5.3)
Sodium: 138 mmol/L (ref 135–146)
Total Bilirubin: 1 mg/dL (ref 0.2–1.2)
Total Protein: 6.7 g/dL (ref 6.1–8.1)

## 2020-10-26 LAB — MICROALBUMIN, URINE: Microalb, Ur: 2.7 mg/dL

## 2020-10-26 LAB — HEMOGLOBIN A1C
Hgb A1c MFr Bld: 9.4 % of total Hgb — ABNORMAL HIGH (ref <5.7)
Mean Plasma Glucose: 223 mg/dL
eAG (mmol/L): 12.4 mmol/L

## 2020-10-29 ENCOUNTER — Encounter: Payer: Self-pay | Admitting: *Deleted

## 2020-10-29 MED ORDER — PIOGLITAZONE HCL 30 MG PO TABS: 30.0000 mg | ORAL_TABLET | Freq: Every day | ORAL | 0 refills | Status: DC

## 2020-10-29 MED ORDER — EMPAGLIFLOZIN 25 MG PO TABS: 25.0000 mg | ORAL_TABLET | Freq: Every day | ORAL | 0 refills | Status: DC

## 2020-11-11 ENCOUNTER — Other Ambulatory Visit: Payer: Self-pay | Admitting: Family Medicine

## 2020-12-15 ENCOUNTER — Other Ambulatory Visit: Payer: Self-pay | Admitting: Family Medicine

## 2021-01-10 ENCOUNTER — Other Ambulatory Visit: Payer: Self-pay | Admitting: Family Medicine

## 2021-01-10 DIAGNOSIS — I480 Paroxysmal atrial fibrillation: Secondary | ICD-10-CM

## 2021-01-13 ENCOUNTER — Telehealth: Payer: Self-pay | Admitting: *Deleted

## 2021-01-13 NOTE — Telephone Encounter (Signed)
Received call from patient wife, Hassan Rowan.   States that patient has not been taking Actos at all and has only been taking Metformin 500 XR x1 tab daily.   States that patient has also changed diet and has cut back on sugar intake. States that fasting CBG's are running 120's.   Advised schedule OV to repeat A1C and discuss medications.   Appointment scheduled.

## 2021-01-31 ENCOUNTER — Other Ambulatory Visit: Payer: Self-pay

## 2021-01-31 ENCOUNTER — Encounter: Payer: Self-pay | Admitting: Family Medicine

## 2021-01-31 ENCOUNTER — Ambulatory Visit (INDEPENDENT_AMBULATORY_CARE_PROVIDER_SITE_OTHER): Payer: 59 | Admitting: Family Medicine

## 2021-01-31 VITALS — BP 122/84 | HR 70 | Temp 97.9°F | Resp 14 | Ht 66.0 in | Wt 242.0 lb

## 2021-01-31 DIAGNOSIS — E1165 Type 2 diabetes mellitus with hyperglycemia: Secondary | ICD-10-CM | POA: Diagnosis not present

## 2021-01-31 NOTE — Progress Notes (Signed)
Subjective:    Patient ID: Steven Klein, male    DOB: 02-05-1961, 60 y.o.   MRN: [id number]  HPI  10/25/20 Patient is here today for a checkup.  He denies any chest pain shortness of breath or dyspnea on exertion.  He is in atrial fibrillation today but his rate is controlled.  He is appropriately taking his anticoagulant Eliquis twice daily.  He denies any diarrhea on metformin.  He states that his fasting blood sugars are between 140 and 150 and his 2-hour postprandial sugars are around 150.  He denies any hypoglycemic episodes.  He denies any tachyarrhythmias.  He can still occasionally have panic attacks which she takes Xanax.  Otherwise he is doing well.  He is due for colon cancer screening.  He is also due for lung cancer screening however he declines both of these today.  He states that he is too busy at work right now and he will call me back when he wants me to schedule him.  He is also due for prostate cancer screening along with fasting lab work.  He denies any polyuria, polydipsia, or blurry vision.  At that time, my plan was:  Blood pressure today is acceptable.  Check CBC, CMP, fasting lipid panel, and A1c.  Goal A1c is less than 6.5.  Check a urine microalbumin.  Goal albumin to creatinine ratio is less than 30.  Check an LDL cholesterol.  Goal LDL cholesterol is less than 100.  Patient received Pneumovax 23.  Recommended smoking cessation strongly.  Recommended a CT scan to screen for lung cancer.  Recommended a colonoscopy.  He declines both of these cancer screening test at the present time but states that he will call me back when he changes his mind.  01/31/21 Diabetes was out of control at 9.4 in May.  I recommended adding Actos 30 mg a day and Jardiance 25 mg a day and rechecking in 3 months.  Here for follow up.  He has been checking his blood sugars regularly.  The lowest blood sugar he reports is 77 however he was asymptomatic with this.  The majority of his fasting blood  sugars in the morning have been between 80 and 100!Marland Kitchen  The sound outstanding.  He denies any hypoglycemic episodes.  He denies any side effects from medication.  Per tickly he denies any yeast infections in his private area or dysuria or urgency or frequency.  He also denies any peripheral edema  Past Medical History:  Diagnosis Date   Adenomatous colon polyp    Anxiety and depression    Atrial fibrillation (HCC)    Cardiomegaly    Depression    Diabetes mellitus without complication (HCC)    Hyperlipidemia    Metabolic syndrome X    Panic attack    Past Surgical History:  Procedure Laterality Date   COLONOSCOPY W/ POLYPECTOMY     ORIF ANKLE FRACTURE     Current Outpatient Medications on File Prior to Visit  Medication Sig Dispense Refill   ALPRAZolam (XANAX) 1 MG tablet TAKE 1/2 TABLET BY MOUTH EVERY 8 HOURS AS NEEDED 30 tablet 0   blood glucose meter kit and supplies Dispense based on patient and insurance preference. Use up to 3 times as directed. (FOR ICD-10 E10.9, E11.9). 1 each 0   Blood Glucose Monitoring Suppl (ACCU-CHEK GUIDE ME) w/Device KIT 1 strip by Does not apply route 2 (two) times daily. (Check BS BID - DX E11.9) 1 kit 2  cetirizine (ZYRTEC) 10 MG tablet Take 10 mg by mouth daily.     clotrimazole-betamethasone (LOTRISONE) cream Apply 1 application topically 2 (two) times daily. 30 g 0   ELIQUIS 5 MG TABS tablet TAKE 1 TABLET BY MOUTH TWICE A DAY 60 tablet 5   glucose blood (ACCU-CHEK GUIDE) test strip Check BS BID DX: E11.9 100 each 3   JARDIANCE 25 MG TABS tablet TAKE 1 TABLET BY MOUTH DAILY BEFORE BREAKFAST. 90 tablet 0   Lancets MISC Please dispense based on patient and insurance preference. Use as directed to monitor FSBS 2x daily. Dx: E11.9 100 each 3   lisinopril (ZESTRIL) 10 MG tablet Take 1 tablet (10 mg total) by mouth daily. 90 tablet 3   metFORMIN (GLUCOPHAGE-XR) 500 MG 24 hr tablet TAKE 2 TABLETS BY MOUTH EVERY DAY WITH BREAKFAST 180 tablet 0   metoprolol  succinate (TOPROL-XL) 50 MG 24 hr tablet TAKE 1 TABLET BY MOUTH ONCE A DAY 90 tablet 3   PARoxetine (PAXIL) 30 MG tablet Take 1 tablet (30 mg total) by mouth daily. 90 tablet 3   pioglitazone (ACTOS) 30 MG tablet TAKE 1 TABLET BY MOUTH EVERY DAY 90 tablet 0   simvastatin (ZOCOR) 40 MG tablet Take 1 tablet (40 mg total) by mouth daily at 6 PM. 90 tablet 1   No current facility-administered medications on file prior to visit.   Allergies  Allergen Reactions   Sulfamethoxazole     Tongue swells   Ezetimibe-Simvastatin     Does not remember   Sulfonamide Derivatives     REACTION: breathing problems   Social History   Socioeconomic History   Marital status: Married    Spouse name: Not on file   Number of children: 1   Years of education: Not on file   Highest education level: Not on file  Occupational History   Occupation: OWNER    Employer: [employer]    Comment: lawn care  Tobacco Use   Smoking status: Every Day   Smokeless tobacco: Never  Substance and Sexual Activity   Alcohol use: No    Alcohol/week: 0.0 standard drinks   Drug use: No   Sexual activity: Not on file  Other Topics Concern   Not on file  Social History Narrative   Not on file   Social Determinants of Health   Financial Resource Strain: Not on file  Food Insecurity: Not on file  Transportation Needs: Not on file  Physical Activity: Not on file  Stress: Not on file  Social Connections: Not on file  Intimate Partner Violence: Not on file     Review of Systems  All other systems reviewed and are negative.     Objective:   Physical Exam Vitals reviewed.  Constitutional:      General: He is not in acute distress.    Appearance: He is well-developed. He is not diaphoretic.  HENT:     Right Ear: External ear normal.     Left Ear: External ear normal.     Nose: Nose normal.     Mouth/Throat:     Pharynx: No oropharyngeal exudate.  Eyes:     Conjunctiva/sclera: Conjunctivae normal.   Neck:     Thyroid: No thyromegaly.  Cardiovascular:     Rate and Rhythm: Normal rate. Rhythm irregular.     Heart sounds: Normal heart sounds. No murmur heard.   No friction rub. No gallop.  Pulmonary:     Effort: Pulmonary effort is normal.  No respiratory distress.     Breath sounds: Normal breath sounds. No wheezing or rales.  Abdominal:     General: Bowel sounds are normal. There is no distension.     Palpations: Abdomen is soft. There is no mass.     Tenderness: There is no abdominal tenderness. There is no guarding or rebound.  Musculoskeletal:     Cervical back: Neck supple.  Lymphadenopathy:     Cervical: No cervical adenopathy.  Neurological:     Mental Status: He is alert and oriented to person, place, and time.     Cranial Nerves: No cranial nerve deficit.     Motor: No abnormal muscle tone.     Coordination: Coordination normal.     Deep Tendon Reflexes: Reflexes are normal and symmetric.          Assessment & Plan:   Uncontrolled type 2 diabetes mellitus with hyperglycemia ([place]) - Plan: Hemoglobin A1c, COMPLETE METABOLIC PANEL WITH GFR, Lipid panel, Microalbumin, urine His reported blood sugar sound outstanding.  I will be happy with an A1c below 7.  I will repeat that today along with a CMP and a lipid panel and urine microalbumin.  If labs conform to the sugars that he is checking at home, we will make no additional changes.  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  Hopefully he will not experience this due to [place].  There is no evidence of any peripheral edema from the Actos

## 2021-02-01 LAB — LIPID PANEL
Cholesterol: 142 mg/dL (ref <200)
HDL: 37 mg/dL — ABNORMAL LOW (ref >=40)
LDL Cholesterol (Calc): 79 mg/dL (calc)
Non-HDL Cholesterol (Calc): 105 mg/dL (calc) (ref <130)
Total CHOL/HDL Ratio: 3.8 (calc) (ref <5.0)
Triglycerides: 162 mg/dL — ABNORMAL HIGH (ref <150)

## 2021-02-01 LAB — COMPLETE METABOLIC PANEL WITH GFR
AG Ratio: 1.4 (calc) (ref 1.0–2.5)
ALT: 11 U/L (ref 9–46)
AST: 14 U/L (ref 10–35)
Albumin: 3.9 g/dL (ref 3.6–5.1)
Alkaline phosphatase (APISO): 82 U/L (ref 35–144)
BUN: 11 mg/dL (ref 7–25)
CO2: 29 mmol/L (ref 20–32)
Calcium: 9.2 mg/dL (ref 8.6–10.3)
Chloride: 104 mmol/L (ref 98–110)
Creat: 0.96 mg/dL (ref 0.70–1.35)
Globulin: 2.7 g/dL (calc) (ref 1.9–3.7)
Glucose, Bld: 87 mg/dL (ref 65–99)
Potassium: 4.3 mmol/L (ref 3.5–5.3)
Sodium: 140 mmol/L (ref 135–146)
Total Bilirubin: 1 mg/dL (ref 0.2–1.2)
Total Protein: 6.6 g/dL (ref 6.1–8.1)
eGFR: 90 mL/min/{1.73_m2} (ref >=60)

## 2021-02-01 LAB — MICROALBUMIN, URINE: Microalb, Ur: 0.8 mg/dL

## 2021-02-01 LAB — HEMOGLOBIN A1C
Hgb A1c MFr Bld: 6.4 % of total Hgb — ABNORMAL HIGH (ref <5.7)
Mean Plasma Glucose: 137 mg/dL
eAG (mmol/L): 7.6 mmol/L

## 2021-02-17 ENCOUNTER — Other Ambulatory Visit: Payer: Self-pay | Admitting: Family Medicine

## 2021-02-17 NOTE — Telephone Encounter (Signed)
Routine medications refilled.   Ok to refill Xanax??  Last office visit 01/31/2021.  Last refill 10/25/2020.

## 2021-04-17 ENCOUNTER — Other Ambulatory Visit: Payer: Self-pay | Admitting: Family Medicine

## 2021-04-18 NOTE — Telephone Encounter (Signed)
Ok to refill??  Last office visit 01/31/2021.  Last refill 02/18/2021.  Ok to add refills to prescription?

## 2021-04-29 ENCOUNTER — Other Ambulatory Visit: Payer: Self-pay | Admitting: Family Medicine

## 2021-06-14 ENCOUNTER — Other Ambulatory Visit: Payer: Self-pay

## 2021-06-14 MED ORDER — PIOGLITAZONE HCL 30 MG PO TABS: 30.0000 mg | ORAL_TABLET | Freq: Every day | ORAL | 3 refills | Status: DC

## 2021-06-22 ENCOUNTER — Telehealth: Payer: Self-pay

## 2021-06-22 NOTE — Telephone Encounter (Signed)
Covermymeds Key [id number].

## 2021-06-22 NOTE — Telephone Encounter (Signed)
Patients insurance requires prior authorization for jardiance.  Alternatives include glimepiride, glipizide, metformin and pioglitazone. Please change or advise to process prior auth.

## 2021-06-23 NOTE — Telephone Encounter (Signed)
(  Key: [id number])  Your information has been submitted to [place]. To check for an updated outcome later, reopen this PA request from your dashboard.  If [employer] has not responded to your request within 24 hours, contact [place] at [phone]. If you think there may be a problem with your PA request, use our live chat feature at the bottom right.

## 2021-07-08 ENCOUNTER — Other Ambulatory Visit: Payer: Self-pay

## 2021-07-08 ENCOUNTER — Telehealth: Payer: Self-pay

## 2021-07-08 DIAGNOSIS — I4891 Unspecified atrial fibrillation: Secondary | ICD-10-CM

## 2021-07-08 DIAGNOSIS — E1165 Type 2 diabetes mellitus with hyperglycemia: Secondary | ICD-10-CM

## 2021-07-08 NOTE — Telephone Encounter (Addendum)
Pt's wife called to report Jardiance rx will cost them over $500 per month. She states they cannot afford to continue this medication. Samples provided to pt for 2 weeks worth of medication.  Referral to [facility] for assistance with meds placed.   Please advise, thanks!

## 2021-07-08 NOTE — Telephone Encounter (Signed)
Approved on June 24, 2021 Your PA request has been approved.

## 2021-07-11 NOTE — Telephone Encounter (Signed)
[place] to advise

## 2021-07-12 NOTE — Telephone Encounter (Signed)
Spoke with pt's wife. They would like to try to get pt assistance for Jardiance. Application printed and mailed to home address in chart. Pt's wife aware they will need to complete patient portion and bring packet back to office for [place] to complete and send in. Packet placed in outgoing mail.

## 2021-07-14 ENCOUNTER — Other Ambulatory Visit: Payer: Self-pay | Admitting: Family Medicine

## 2021-07-14 DIAGNOSIS — I480 Paroxysmal atrial fibrillation: Secondary | ICD-10-CM

## 2021-08-31 DIAGNOSIS — Z85828 Personal history of other malignant neoplasm of skin: Secondary | ICD-10-CM | POA: Diagnosis not present

## 2021-08-31 DIAGNOSIS — L814 Other melanin hyperpigmentation: Secondary | ICD-10-CM | POA: Diagnosis not present

## 2021-08-31 DIAGNOSIS — Z08 Encounter for follow-up examination after completed treatment for malignant neoplasm: Secondary | ICD-10-CM | POA: Diagnosis not present

## 2021-08-31 DIAGNOSIS — L812 Freckles: Secondary | ICD-10-CM | POA: Diagnosis not present

## 2021-08-31 DIAGNOSIS — L821 Other seborrheic keratosis: Secondary | ICD-10-CM | POA: Diagnosis not present

## 2021-08-31 DIAGNOSIS — D225 Melanocytic nevi of trunk: Secondary | ICD-10-CM | POA: Diagnosis not present

## 2021-08-31 DIAGNOSIS — L570 Actinic keratosis: Secondary | ICD-10-CM | POA: Diagnosis not present

## 2021-08-31 DIAGNOSIS — L819 Disorder of pigmentation, unspecified: Secondary | ICD-10-CM | POA: Diagnosis not present

## 2021-09-06 ENCOUNTER — Telehealth: Payer: Self-pay | Admitting: Family Medicine

## 2021-09-06 NOTE — Progress Notes (Signed)
?  Chronic Care Management  ? ?Outreach Note ? ?09/06/2021 ?Name: Steven Klein MRN: [id number] DOB: 1960/12/22 ? ?Referred by: Susy Frizzle, MD ?Reason for referral : No chief complaint on file. ? ? ?An unsuccessful telephone outreach was attempted today. The patient was referred to the pharmacist for assistance with care management and care coordination.  ? ?Follow Up Plan: LEFT VM TO OBTAIN INSURANCE INFORMATION ? ?Steven Klein ?Upstream Scheduler  ?

## 2021-09-08 ENCOUNTER — Other Ambulatory Visit: Payer: Self-pay | Admitting: Family Medicine

## 2021-09-08 ENCOUNTER — Telehealth: Payer: Self-pay

## 2021-09-08 ENCOUNTER — Telehealth: Payer: Self-pay | Admitting: Family Medicine

## 2021-09-08 NOTE — Telephone Encounter (Signed)
Per pt's wife, called stating that pt is going to have his tooth pulled. Would like to know if there are medications he need to stop taking before his appt Next Tuesday.  Dentist concerned re Eliquis? ? ?Please advice ?

## 2021-09-08 NOTE — Progress Notes (Signed)
?  Chronic Care Management  ? ?Note ? ?09/08/2021 ?Name: Steven Klein MRN: [id number] DOB: 1961/02/03 ? ?Steven Klein is a 61 y.o. year old male who is a primary care patient of Pickard, Cammie Mcgee, MD. I reached out to Namon Cirri by phone today in response to a referral sent by Steven Klein's PCP, Susy Frizzle, MD.  ? ?Mr. July was given information about Chronic Care Management services today including:  ?CCM service includes personalized support from designated clinical staff supervised by his physician, including individualized plan of care and coordination with other care providers ?24/7 contact phone numbers for assistance for urgent and routine care needs. ?Service will only be billed when office clinical staff spend 20 minutes or more in a month to coordinate care. ?Only one practitioner may furnish and bill the service in a calendar month. ?The patient may stop CCM services at any time (effective at the end of the month) by phone call to the office staff. ? ? ?Patient did not agree to enrollment in care management services and does not wish to consider at this time. ? ?Follow up plan: ?Spoke with pt wife pt is not covered therefore declined visit ? ?Tatjana Dellinger ?Upstream Scheduler  ?

## 2021-10-20 ENCOUNTER — Other Ambulatory Visit: Payer: Self-pay | Admitting: Family Medicine

## 2021-10-20 NOTE — Telephone Encounter (Signed)
Requested medication (s) are due for refill today: yes ? ?Requested medication (s) are on the active medication list: yes ? ?Last refill:  04/19/21 #30/3 ? ?Future visit scheduled: no ? ?Notes to clinic:  Unable to refill per protocol, cannot delegate.pt is overdue for appt as well per our protocol. Please advise if pt needs appt.  ? ? ?  ?Requested Prescriptions  ?Pending Prescriptions Disp Refills  ? ALPRAZolam (XANAX) 1 MG tablet [Pharmacy Med Name: ALPRAZOLAM 1 MG TABLET] 30 tablet   ?  Sig: TAKE 1/2 TABLET BY MOUTH EVERY 8 HOURS AS NEEDED  ?  ? Not Delegated - Psychiatry: Anxiolytics/Hypnotics 2 Failed - 10/20/2021  2:26 PM  ?  ?  Failed - This refill cannot be delegated  ?  ?  Failed - Urine Drug Screen completed in last 360 days  ?  ?  Failed - Valid encounter within last 6 months  ?  Recent Outpatient Visits   ? ?      ? 8 months ago Uncontrolled type 2 diabetes mellitus with hyperglycemia ([place])  ? [facility] Pickard, Cammie Mcgee, MD  ? 12 months ago Atrial fibrillation, unspecified type [facility])  ? [facility] Pickard, Cammie Mcgee, MD  ? 1 year ago Candidal intertrigo  ? [facility] Pickard, Cammie Mcgee, MD  ? 2 years ago New onset type 2 diabetes mellitus ([place])  ? [facility] Pickard, Cammie Mcgee, MD  ? 3 years ago New onset type 2 diabetes mellitus ([place])  ? [facility] Pickard, Cammie Mcgee, MD  ? ?  ?  ? ? ?  ?  ?  Passed - Patient is not pregnant  ?  ?  ? ?

## 2021-10-21 NOTE — Telephone Encounter (Signed)
LOV 01/31/21 ?Last refill 04/19/21, #30, 3 refills ? ?Please review, thanks! ? ?

## 2021-11-03 ENCOUNTER — Ambulatory Visit: Payer: Self-pay

## 2021-11-05 ENCOUNTER — Other Ambulatory Visit: Payer: Self-pay | Admitting: Family Medicine

## 2021-11-07 NOTE — Telephone Encounter (Signed)
Patient will need to schedule a follow up appointment for further refills Requested Prescriptions  Pending Prescriptions Disp Refills  . simvastatin (ZOCOR) 40 MG tablet [Pharmacy Med Name: SIMVASTATIN 40 MG TABLET] 90 tablet 0    Sig: TAKE 1 TABLET BY MOUTH DAILY AT 6 PM.     Cardiovascular:  Antilipid - Statins Failed - 11/05/2021  9:18 AM      Failed - Lipid Panel in normal range within the last 12 months    Cholesterol  Date Value Ref Range Status  01/31/2021 142 <200 mg/dL Final   LDL Cholesterol (Calc)  Date Value Ref Range Status  01/31/2021 79 mg/dL (calc) Final    Comment:    Reference range: <100 . Desirable range <100 mg/dL for primary prevention;   <70 mg/dL for patients with CHD or diabetic patients  with > or = 2 CHD risk factors. Marland Kitchen LDL-C is now calculated using the Martin-Hopkins  calculation, which is a validated novel method providing  better accuracy than the Friedewald equation in the  estimation of LDL-C.  Cresenciano Genre et al. Annamaria Helling. 2836;629(47): 2061-2068  (http://education.[facility].com/faq/FAQ164)    HDL  Date Value Ref Range Status  01/31/2021 37 (L) > OR = 40 mg/dL Final   Triglycerides  Date Value Ref Range Status  01/31/2021 162 (H) <150 mg/dL Final         Passed - Patient is not pregnant      Passed - Valid encounter within last 12 months    Recent Outpatient Visits          9 months ago Uncontrolled type 2 diabetes mellitus with hyperglycemia ([place])   [place] Pickard, Cammie Mcgee, MD   1 year ago Atrial fibrillation, unspecified type [facility])   [place] Pickard, Cammie Mcgee, MD   1 year ago Candidal intertrigo   [place] Susy Frizzle, MD   2 years ago New onset type 2 diabetes mellitus ([place])   [place] Susy Frizzle, MD   3 years ago New onset type 2 diabetes mellitus ([place])   [facility] Pickard, Cammie Mcgee, MD

## 2021-11-09 ENCOUNTER — Other Ambulatory Visit: Payer: Self-pay | Admitting: Family Medicine

## 2021-11-09 DIAGNOSIS — E7800 Pure hypercholesterolemia, unspecified: Secondary | ICD-10-CM

## 2021-11-09 DIAGNOSIS — F410 Panic disorder [episodic paroxysmal anxiety] without agoraphobia: Secondary | ICD-10-CM

## 2021-11-11 NOTE — Telephone Encounter (Signed)
Requested medication (s) are due for refill today: yes  Requested medication (s) are on the active medication list: yes  Last refill:  10/25/20  Future visit scheduled: no  Notes to clinic:  Called pt and LM on VM to office back to make appointment for refills    Requested Prescriptions  Pending Prescriptions Disp Refills   lisinopril (ZESTRIL) 10 MG tablet [Pharmacy Med Name: LISINOPRIL 10 MG TABLET] 90 tablet 3    Sig: TAKE 1 TABLET BY MOUTH EVERY DAY     Cardiovascular:  ACE Inhibitors Failed - 11/09/2021  4:46 PM      Failed - Cr in normal range and within 180 days    Creat  Date Value Ref Range Status  01/31/2021 0.96 0.70 - 1.35 mg/dL Final   Creatinine, Urine  Date Value Ref Range Status  09/17/2019 89 20 - 320 mg/dL Final         Failed - K in normal range and within 180 days    Potassium  Date Value Ref Range Status  01/31/2021 4.3 3.5 - 5.3 mmol/L Final         Failed - Valid encounter within last 6 months    Recent Outpatient Visits           9 months ago Uncontrolled type 2 diabetes mellitus with hyperglycemia ([place])   [place] Pickard, Cammie Mcgee, MD   1 year ago Atrial fibrillation, unspecified type [facility])   [place] Susy Frizzle, MD   1 year ago Candidal intertrigo   [place] Susy Frizzle, MD   2 years ago New onset type 2 diabetes mellitus ([place])   [place] Susy Frizzle, MD   3 years ago New onset type 2 diabetes mellitus [facility])   [place], Cammie Mcgee, MD               Passed - Patient is not pregnant      Passed - Last BP in normal range    BP Readings from Last 1 Encounters:  01/31/21 122/84

## 2021-12-01 ENCOUNTER — Ambulatory Visit (INDEPENDENT_AMBULATORY_CARE_PROVIDER_SITE_OTHER): Payer: 59 | Admitting: Family Medicine

## 2021-12-01 VITALS — BP 140/90 | HR 78 | Temp 98.0°F | Ht 66.0 in | Wt 243.8 lb

## 2021-12-01 DIAGNOSIS — E7800 Pure hypercholesterolemia, unspecified: Secondary | ICD-10-CM

## 2021-12-01 DIAGNOSIS — E118 Type 2 diabetes mellitus with unspecified complications: Secondary | ICD-10-CM

## 2021-12-01 DIAGNOSIS — I4891 Unspecified atrial fibrillation: Secondary | ICD-10-CM

## 2021-12-01 MED ORDER — ACCU-CHEK GUIDE VI STRP: ORAL_STRIP | 3 refills | Status: DC

## 2021-12-01 NOTE — Progress Notes (Addendum)
Subjective:    Patient ID: Steven Klein, male    DOB: [birth year], 61 y.o.   MRN: [id number]  HPI  I last saw the patient in 2022.  Overdue for  follow up of his diabetes.  Last A1c was 6.4 (8/22).  He stopped taking Jardiance due to cost.  His fasting blood sugars in the morning of been 119, 90, 120, 110, 117.  He is checking it 3-4 times a week.  They are all well below 130.  He denies any hypoglycemic episodes.  He denies any polyuria, polydipsia, blurry vision.  His shingles vaccine is up-to-date.  His pneumonia vaccine is up-to-date.  Diabetic foot exam was performed today and is significant only for painful corn on the plantar aspect of his right foot near the third metatarsal head.  This is only mildly tender to the patient and he declines having it excised.  Otherwise he is doing well.  He is due for diabetic eye exam however his insurance will not cover this so he declines referral.  His blood pressure today is borderline elevated however he is anxious and he states that his blood pressure is better at home. Past Medical History:  Diagnosis Date   Adenomatous colon polyp    Anxiety and depression    Atrial fibrillation ([place])    Cardiomegaly    Depression    Diabetes mellitus without complication ([place])    Hyperlipidemia    Metabolic syndrome X    Panic attack    Past Surgical History:  Procedure Laterality Date   COLONOSCOPY W/ POLYPECTOMY     ORIF ANKLE FRACTURE     Current Outpatient Medications on File Prior to Visit  Medication Sig Dispense Refill   ALPRAZolam (XANAX) 1 MG tablet TAKE 1/2 TABLET BY MOUTH EVERY 8 HOURS AS NEEDED 30 tablet 0   albuterol (VENTOLIN HFA) 108 (90 Base) MCG/ACT inhaler SMARTSIG:1 Puff(s) Via Inhaler Every 4 Hours PRN     blood glucose meter kit and supplies Dispense based on patient and insurance preference. Use up to 3 times as directed. (FOR ICD-10 E10.9, E11.9). 1 each 0   Blood Glucose Monitoring Suppl (ACCU-CHEK GUIDE ME) w/Device KIT 1  strip by Does not apply route 2 (two) times daily. (Check BS BID - DX E11.9) 1 kit 2   cetirizine (ZYRTEC) 10 MG tablet Take 10 mg by mouth daily.     clotrimazole-betamethasone (LOTRISONE) cream Apply 1 application topically 2 (two) times daily. 30 g 0   ELIQUIS 5 MG TABS tablet TAKE 1 TABLET BY MOUTH TWICE A DAY 60 tablet 5   glucose blood (ACCU-CHEK GUIDE) test strip Check BS BID DX: E11.9 100 each 3   JARDIANCE 25 MG TABS tablet TAKE 1 TABLET BY MOUTH EVERY DAY BEFORE BREAKFAST 90 tablet 3   Lancets MISC Please dispense based on patient and insurance preference. Use as directed to monitor FSBS 2x daily. Dx: E11.9 100 each 3   lisinopril (ZESTRIL) 10 MG tablet TAKE 1 TABLET BY MOUTH EVERY DAY 30 tablet 0   metFORMIN (GLUCOPHAGE-XR) 500 MG 24 hr tablet TAKE 2 TABLETS BY MOUTH EVERY DAY WITH BREAKFAST 180 tablet 2   metoprolol succinate (TOPROL-XL) 50 MG 24 hr tablet TAKE 1 TABLET BY MOUTH ONCE A DAY 90 tablet 3   PARoxetine (PAXIL) 30 MG tablet TAKE 1 TABLET BY MOUTH EVERY DAY 90 tablet 1   pioglitazone (ACTOS) 30 MG tablet Take 1 tablet (30 mg total) by mouth daily. 90 tablet 3  simvastatin (ZOCOR) 40 MG tablet TAKE 1 TABLET BY MOUTH DAILY AT 6 PM. 90 tablet 0   No current facility-administered medications on file prior to visit.   Allergies  Allergen Reactions   Sulfamethoxazole     Tongue swells   Ezetimibe-Simvastatin     Does not remember   Sulfonamide Derivatives     REACTION: breathing problems   Social History   Socioeconomic History   Marital status: Married    Spouse name: Not on file   Number of children: 1   Years of education: Not on file   Highest education level: Not on file  Occupational History   Occupation: OWNER    Employer: [employer]    Comment: lawn care  Tobacco Use   Smoking status: Every Day   Smokeless tobacco: Never  Substance and Sexual Activity   Alcohol use: No    Alcohol/week: 0.0 standard drinks of alcohol   Drug use: No    Sexual activity: Not on file  Other Topics Concern   Not on file  Social History Narrative   Not on file   Social Determinants of Health   Financial Resource Strain: Not on file  Food Insecurity: Not on file  Transportation Needs: Not on file  Physical Activity: Not on file  Stress: Not on file  Social Connections: Not on file  Intimate Partner Violence: Not on file     Review of Systems  All other systems reviewed and are negative.      Objective:   Physical Exam Vitals reviewed.  Constitutional:      General: He is not in acute distress.    Appearance: He is well-developed. He is not diaphoretic.  HENT:     Right Ear: External ear normal.     Left Ear: External ear normal.     Nose: Nose normal.     Mouth/Throat:     Pharynx: No oropharyngeal exudate.  Eyes:     Conjunctiva/sclera: Conjunctivae normal.  Neck:     Thyroid: No thyromegaly.  Cardiovascular:     Rate and Rhythm: Normal rate. Rhythm irregular.     Heart sounds: Normal heart sounds. No murmur heard.    No friction rub. No gallop.  Pulmonary:     Effort: Pulmonary effort is normal. No respiratory distress.     Breath sounds: Normal breath sounds. No wheezing or rales.  Abdominal:     General: Bowel sounds are normal. There is no distension.     Palpations: Abdomen is soft. There is no mass.     Tenderness: There is no abdominal tenderness. There is no guarding or rebound.  Musculoskeletal:     Cervical back: Neck supple.  Lymphadenopathy:     Cervical: No cervical adenopathy.  Neurological:     Mental Status: He is alert and oriented to person, place, and time.     Cranial Nerves: No cranial nerve deficit.     Motor: No abnormal muscle tone.     Coordination: Coordination normal.     Deep Tendon Reflexes: Reflexes are normal and symmetric.           Assessment & Plan:   Atrial fibrillation, unspecified type ([place])  Controlled type 2 diabetes mellitus with complication, without  long-term current use of insulin (HCC) - Plan: Hemoglobin A1c, CBC with Differential/Platelet, Lipid panel, Microalbumin, urine  Pure hypercholesterolemia Patient is reported blood sugars sound well controlled.  He is currently in atrial fibrillation today however his rate is  controlled and he is anticoagulated with Eliquis.  His blood pressure at home is well controlled.  His blood sugars sound well controlled.  Check a fasting lipid panel.  Goal LDL cholesterol is less than 100.  Goal hemoglobin A1c is less than 6.5.  Goal albumin to creatinine ratio is less than 30.  Recommended diabetic eye exam.  Offered excision of the plantar corn but patient declined

## 2021-12-02 LAB — CBC WITH DIFFERENTIAL/PLATELET
Absolute Monocytes: 713 cells/uL (ref 200–950)
Basophils Absolute: 70 cells/uL (ref 0–200)
Basophils Relative: 0.8 %
Eosinophils Absolute: 313 cells/uL (ref 15–500)
Eosinophils Relative: 3.6 %
HCT: 48.9 % (ref 38.5–50.0)
Hemoglobin: 16.5 g/dL (ref 13.2–17.1)
Lymphs Abs: 2018 cells/uL (ref 850–3900)
MCH: 30.2 pg (ref 27.0–33.0)
MCHC: 33.7 g/dL (ref 32.0–36.0)
MCV: 89.6 fL (ref 80.0–100.0)
MPV: 11.2 fL (ref 7.5–12.5)
Monocytes Relative: 8.2 %
Neutro Abs: 5585 cells/uL (ref 1500–7800)
Neutrophils Relative %: 64.2 %
Platelets: 186 10*3/uL (ref 140–400)
RBC: 5.46 10*6/uL (ref 4.20–5.80)
RDW: 13.4 % (ref 11.0–15.0)
Total Lymphocyte: 23.2 %
WBC: 8.7 10*3/uL (ref 3.8–10.8)

## 2021-12-02 LAB — MICROALBUMIN, URINE: Microalb, Ur: 1.3 mg/dL

## 2021-12-02 LAB — LIPID PANEL
Cholesterol: 138 mg/dL (ref <200)
HDL: 40 mg/dL (ref >=40)
LDL Cholesterol (Calc): 74 mg/dL (calc)
Non-HDL Cholesterol (Calc): 98 mg/dL (calc) (ref <130)
Total CHOL/HDL Ratio: 3.5 (calc) (ref <5.0)
Triglycerides: 161 mg/dL — ABNORMAL HIGH (ref <150)

## 2021-12-02 LAB — HEMOGLOBIN A1C
Hgb A1c MFr Bld: 6 % of total Hgb — ABNORMAL HIGH (ref <5.7)
Mean Plasma Glucose: 126 mg/dL
eAG (mmol/L): 7 mmol/L

## 2021-12-07 ENCOUNTER — Other Ambulatory Visit: Payer: Self-pay | Admitting: Family Medicine

## 2021-12-07 DIAGNOSIS — E7800 Pure hypercholesterolemia, unspecified: Secondary | ICD-10-CM

## 2021-12-18 ENCOUNTER — Other Ambulatory Visit: Payer: Self-pay | Admitting: Family Medicine

## 2021-12-19 NOTE — Telephone Encounter (Signed)
Requested medication (s) are due for refill today: Yes  Requested medication (s) are on the active medication list: Yes  Last refill:  10/21/21  Future visit scheduled: No  Notes to clinic:  See request.    Requested Prescriptions  Pending Prescriptions Disp Refills   ALPRAZolam (XANAX) 1 MG tablet [Pharmacy Med Name: ALPRAZOLAM 1 MG TABLET] 30 tablet 0    Sig: TAKE 1/2 TABLET BY MOUTH EVERY 8 HOURS AS NEEDED     Not Delegated - Psychiatry: Anxiolytics/Hypnotics 2 Failed - 12/18/2021 10:49 AM      Failed - This refill cannot be delegated      Failed - Urine Drug Screen completed in last 360 days      Failed - Valid encounter within last 6 months    Recent Outpatient Visits           10 months ago Uncontrolled type 2 diabetes mellitus with hyperglycemia ([place])   [place] Pickard, Cammie Mcgee, MD   1 year ago Atrial fibrillation, unspecified type [facility])   [place] Pickard, Cammie Mcgee, MD   1 year ago Candidal intertrigo   [place] Susy Frizzle, MD   2 years ago New onset type 2 diabetes mellitus ([place])   [place] Susy Frizzle, MD   3 years ago New onset type 2 diabetes mellitus ([place])   [place], Cammie Mcgee, MD              Passed - Patient is not pregnant

## 2021-12-29 NOTE — Telephone Encounter (Signed)
Wife calling to check the status of this refill.  Last OV 12/01/2021

## 2021-12-30 NOTE — Telephone Encounter (Signed)
LVM letting patient know that this medication was called in.

## 2021-12-31 ENCOUNTER — Other Ambulatory Visit: Payer: Self-pay | Admitting: Family Medicine

## 2021-12-31 DIAGNOSIS — E7800 Pure hypercholesterolemia, unspecified: Secondary | ICD-10-CM

## 2022-01-02 NOTE — Telephone Encounter (Signed)
Pt had OV 12/01/21, will refill medication.  Requested Prescriptions  Pending Prescriptions Disp Refills  . lisinopril (ZESTRIL) 10 MG tablet [Pharmacy Med Name: LISINOPRIL 10 MG TABLET] 30 tablet 0    Sig: TAKE 1 TABLET BY MOUTH EVERY DAY     Cardiovascular:  ACE Inhibitors Failed - 12/31/2021 12:30 PM      Failed - Cr in normal range and within 180 days    Creat  Date Value Ref Range Status  01/31/2021 0.96 0.70 - 1.35 mg/dL Final   Creatinine, Urine  Date Value Ref Range Status  09/17/2019 89 20 - 320 mg/dL Final         Failed - K in normal range and within 180 days    Potassium  Date Value Ref Range Status  01/31/2021 4.3 3.5 - 5.3 mmol/L Final         Failed - Last BP in normal range    BP Readings from Last 1 Encounters:  12/01/21 140/90         Failed - Valid encounter within last 6 months    Recent Outpatient Visits          11 months ago Uncontrolled type 2 diabetes mellitus with hyperglycemia ([place])   [place] Pickard, Cammie Mcgee, MD   1 year ago Atrial fibrillation, unspecified type [facility])   [place] Susy Frizzle, MD   1 year ago Candidal intertrigo   [place] Susy Frizzle, MD   2 years ago New onset type 2 diabetes mellitus ([place])   [place] Susy Frizzle, MD   3 years ago New onset type 2 diabetes mellitus ([place])   [facility] Pickard, Cammie Mcgee, MD             Passed - Patient is not pregnant

## 2022-01-04 ENCOUNTER — Other Ambulatory Visit: Payer: Self-pay | Admitting: Family Medicine

## 2022-01-04 DIAGNOSIS — I480 Paroxysmal atrial fibrillation: Secondary | ICD-10-CM

## 2022-01-05 NOTE — Telephone Encounter (Signed)
Requested Prescriptions  Pending Prescriptions Disp Refills  . ELIQUIS 5 MG TABS tablet [Pharmacy Med Name: ELIQUIS 5 MG TABLET] 60 tablet 5    Sig: TAKE 1 TABLET BY MOUTH TWICE A DAY     Hematology:  Anticoagulants - apixaban Passed - 01/04/2022  3:22 AM      Passed - PLT in normal range and within 360 days    Platelets  Date Value Ref Range Status  12/01/2021 186 140 - 400 Thousand/uL Final         Passed - HGB in normal range and within 360 days    Hemoglobin  Date Value Ref Range Status  12/01/2021 16.5 13.2 - 17.1 g/dL Final         Passed - HCT in normal range and within 360 days    HCT  Date Value Ref Range Status  12/01/2021 48.9 38.5 - 50.0 % Final         Passed - Cr in normal range and within 360 days    Creat  Date Value Ref Range Status  01/31/2021 0.96 0.70 - 1.35 mg/dL Final   Creatinine, Urine  Date Value Ref Range Status  09/17/2019 89 20 - 320 mg/dL Final         Passed - AST in normal range and within 360 days    AST  Date Value Ref Range Status  01/31/2021 14 10 - 35 U/L Final         Passed - ALT in normal range and within 360 days    ALT  Date Value Ref Range Status  01/31/2021 11 9 - 46 U/L Final         Passed - Valid encounter within last 12 months    Recent Outpatient Visits          11 months ago Uncontrolled type 2 diabetes mellitus with hyperglycemia ([place])   [place] Pickard, Cammie Mcgee, MD   1 year ago Atrial fibrillation, unspecified type [facility])   [place] [facility] Pickard, Cammie Mcgee, MD   1 year ago Candidal intertrigo   [place] [facility] Susy Frizzle, MD   2 years ago New onset type 2 diabetes mellitus ([place])   [place] [facility] Susy Frizzle, MD   3 years ago New onset type 2 diabetes mellitus ([place])   [facility] Pickard, Cammie Mcgee, MD

## 2022-02-03 ENCOUNTER — Other Ambulatory Visit: Payer: Self-pay | Admitting: Family Medicine

## 2022-02-11 ENCOUNTER — Other Ambulatory Visit: Payer: Self-pay | Admitting: Family Medicine

## 2022-02-13 NOTE — Telephone Encounter (Signed)
Requested medication (s) are due for refill today:   Provider to review  Requested medication (s) are on the active medication list:   Yes  Future visit scheduled:   No   Last ordered: 12/30/2021 #30, 0 refills  Non delegated refill reason returned   Requested Prescriptions  Pending Prescriptions Disp Refills   ALPRAZolam (XANAX) 1 MG tablet [Pharmacy Med Name: ALPRAZOLAM 1 MG TABLET] 30 tablet 0    Sig: TAKE 1/2 TABLET BY MOUTH UP TO EVERY 8 HOURS AS NEEDED     Not Delegated - Psychiatry: Anxiolytics/Hypnotics 2 Failed - 02/11/2022 10:52 AM      Failed - This refill cannot be delegated      Failed - Urine Drug Screen completed in last 360 days      Failed - Valid encounter within last 6 months    Recent Outpatient Visits           1 year ago Uncontrolled type 2 diabetes mellitus with hyperglycemia ([place])   [place] Pickard, Cammie Mcgee, MD   1 year ago Atrial fibrillation, unspecified type [facility])   [place] Pickard, Cammie Mcgee, MD   1 year ago Candidal intertrigo   [place] Susy Frizzle, MD   2 years ago New onset type 2 diabetes mellitus ([place])   [place] Susy Frizzle, MD   3 years ago New onset type 2 diabetes mellitus ([place])   [place], Cammie Mcgee, MD              Passed - Patient is not pregnant      BS

## 2022-03-06 DIAGNOSIS — L578 Other skin changes due to chronic exposure to nonionizing radiation: Secondary | ICD-10-CM | POA: Diagnosis not present

## 2022-03-06 DIAGNOSIS — L570 Actinic keratosis: Secondary | ICD-10-CM | POA: Diagnosis not present

## 2022-03-06 DIAGNOSIS — L821 Other seborrheic keratosis: Secondary | ICD-10-CM | POA: Diagnosis not present

## 2022-03-07 ENCOUNTER — Other Ambulatory Visit: Payer: Self-pay | Admitting: Family Medicine

## 2022-03-07 MED ORDER — METOPROLOL SUCCINATE ER 50 MG PO TB24: 50.0000 mg | ORAL_TABLET | Freq: Every day | ORAL | 0 refills | Status: DC

## 2022-03-07 NOTE — Telephone Encounter (Signed)
Received eFax from pharmacy to request refill of/new script for  metoprolol succinate (TOPROL-XL) 50 MG 24 hr tablet [[id number]]   [facility]  [id number] [place] [place] [id number] Phone:  [phone] Fax:  [phone] DEA #:  [id number] LOV: 12/01/21  Please advise pharmacist at [phone]

## 2022-03-07 NOTE — Telephone Encounter (Signed)
Requested Prescriptions  Pending Prescriptions Disp Refills  . metoprolol succinate (TOPROL-XL) 50 MG 24 hr tablet 90 tablet 3    Sig: Take 1 tablet (50 mg total) by mouth daily. Take with or immediately following a meal.     Cardiovascular:  Beta Blockers Failed - 03/07/2022 10:46 AM      Failed - Last BP in normal range    BP Readings from Last 1 Encounters:  12/01/21 140/90         Failed - Valid encounter within last 6 months    Recent Outpatient Visits          1 year ago Uncontrolled type 2 diabetes mellitus with hyperglycemia ([place])   [place] Pickard, Cammie Mcgee, MD   1 year ago Atrial fibrillation, unspecified type [facility])   [place] Pickard, Cammie Mcgee, MD   1 year ago Candidal intertrigo   [place] Susy Frizzle, MD   2 years ago New onset type 2 diabetes mellitus ([place])   [place] Susy Frizzle, MD   3 years ago New onset type 2 diabetes mellitus [facility]   [place] Pickard, Cammie Mcgee, MD             Passed - Last Heart Rate in normal range    Pulse Readings from Last 1 Encounters:  12/01/21 78

## 2022-03-13 ENCOUNTER — Other Ambulatory Visit: Payer: Self-pay | Admitting: Family Medicine

## 2022-03-13 DIAGNOSIS — E7800 Pure hypercholesterolemia, unspecified: Secondary | ICD-10-CM

## 2022-04-10 ENCOUNTER — Other Ambulatory Visit: Payer: Self-pay | Admitting: Family Medicine

## 2022-04-14 ENCOUNTER — Other Ambulatory Visit: Payer: Self-pay | Admitting: Family Medicine

## 2022-04-14 DIAGNOSIS — I480 Paroxysmal atrial fibrillation: Secondary | ICD-10-CM

## 2022-04-14 MED ORDER — APIXABAN 5 MG PO TABS: 5.0000 mg | ORAL_TABLET | Freq: Two times a day (BID) | ORAL | 2 refills | Status: DC

## 2022-04-14 NOTE — Telephone Encounter (Signed)
Received eFax from pharmacy to request refill of or new script for:  ELIQUIS 5 MG TABS tablet Dose, Route, Frequency: As Directed  Dispense Quantity: 60 tablet Refills: 2        Sig: TAKE 1 TABLET BY MOUTH TWICE A DAY       Start Date: 01/05/22 End Date: --  Written Date: 01/05/22 Expiration Date: 01/05/23     Associated Diagnoses: Paroxysmal atrial fibrillation ([place]) [I48.0]  Original Order:  ELIQUIS 5 MG TABS tablet [[id number]]    [facility]  [id number] [id number] Phone:  [phone] Fax:  [phone] DEA #:  [id number] LOV: 12/01/2021  Please advise pharmacist.

## 2022-04-14 NOTE — Telephone Encounter (Signed)
Requested medications are due for refill today.  yes  Requested medications are on the active medications list.  yes  Last refill. 01/05/2022 #60 2 rf  Future visit scheduled.   no  Notes to clinic.  Labs are expired.    Requested Prescriptions  Pending Prescriptions Disp Refills   apixaban (ELIQUIS) 5 MG TABS tablet 60 tablet 2    Sig: Take 1 tablet (5 mg total) by mouth 2 (two) times daily.     Hematology:  Anticoagulants - apixaban Failed - 04/14/2022 11:46 AM      Failed - Cr in normal range and within 360 days    Creat  Date Value Ref Range Status  01/31/2021 0.96 0.70 - 1.35 mg/dL Final   Creatinine, Urine  Date Value Ref Range Status  09/17/2019 89 20 - 320 mg/dL Final         Failed - AST in normal range and within 360 days    AST  Date Value Ref Range Status  01/31/2021 14 10 - 35 U/L Final         Failed - ALT in normal range and within 360 days    ALT  Date Value Ref Range Status  01/31/2021 11 9 - 46 U/L Final         Failed - Valid encounter within last 12 months    Recent Outpatient Visits           1 year ago Uncontrolled type 2 diabetes mellitus with hyperglycemia ([place])   [place] Pickard, Cammie Mcgee, MD   1 year ago Atrial fibrillation, unspecified type [facility])   [place] Pickard, Cammie Mcgee, MD   2 years ago Candidal intertrigo   [place] Susy Frizzle, MD   2 years ago New onset type 2 diabetes mellitus ([place])   [place] Susy Frizzle, MD   3 years ago New onset type 2 diabetes mellitus ([place])   [place] Pickard, Cammie Mcgee, MD              Passed - PLT in normal range and within 360 days    Platelets  Date Value Ref Range Status  12/01/2021 186 140 - 400 Thousand/uL Final         Passed - HGB in normal range and within 360 days    Hemoglobin  Date Value Ref Range Status  12/01/2021 16.5 13.2 - 17.1 g/dL Final         Passed - HCT  in normal range and within 360 days    HCT  Date Value Ref Range Status  12/01/2021 48.9 38.5 - 50.0 % Final

## 2022-04-15 ENCOUNTER — Other Ambulatory Visit: Payer: Self-pay | Admitting: Family Medicine

## 2022-04-17 NOTE — Telephone Encounter (Signed)
Requested medication (s) are due for refill today: yes  Requested medication (s) are on the active medication list: yes  Last refill:  02/14/22 #30 with 0 RF  Future visit scheduled: no, seen 12/01/21  Notes to clinic:  This medication can not be delegated, please assess.        Requested Prescriptions  Pending Prescriptions Disp Refills   ALPRAZolam (XANAX) 1 MG tablet [Pharmacy Med Name: ALPRAZOLAM 1 MG TABLET] 30 tablet 0    Sig: TAKE 1/2 TABLET BY MOUTH UP TO EVERY 8 HOURS AS NEEDED     Not Delegated - Psychiatry: Anxiolytics/Hypnotics 2 Failed - 04/15/2022 10:08 AM      Failed - This refill cannot be delegated      Failed - Urine Drug Screen completed in last 360 days      Failed - Valid encounter within last 6 months    Recent Outpatient Visits           1 year ago Uncontrolled type 2 diabetes mellitus with hyperglycemia ([place])   [place] Pickard, Cammie Mcgee, MD   1 year ago Atrial fibrillation, unspecified type [facility])   [place] Pickard, Cammie Mcgee, MD   2 years ago Candidal intertrigo   [place] Susy Frizzle, MD   2 years ago New onset type 2 diabetes mellitus ([place])   [place] Susy Frizzle, MD   3 years ago New onset type 2 diabetes mellitus ([place])   [facility] Pickard, Cammie Mcgee, MD              Passed - Patient is not pregnant

## 2022-05-06 ENCOUNTER — Other Ambulatory Visit: Payer: Self-pay | Admitting: Family Medicine

## 2022-05-09 ENCOUNTER — Other Ambulatory Visit: Payer: Self-pay | Admitting: Family Medicine

## 2022-05-09 NOTE — Telephone Encounter (Signed)
Requested medications are due for refill today.  no  Requested medications are on the active medications list.  yes  Last refill. 03/13/2022 #30 5 rf  Future visit scheduled.   no  Notes to clinic.  Pharmacy comment: REQUEST FOR 90 DAYS PRESCRIPTION.     Requested Prescriptions  Pending Prescriptions Disp Refills   PARoxetine (PAXIL) 30 MG tablet [Pharmacy Med Name: PAROXETINE HCL 30 MG TABLET] 90 tablet 2    Sig: TAKE 1 TABLET BY MOUTH EVERY DAY     Psychiatry:  Antidepressants - SSRI Failed - 05/09/2022 12:31 PM      Failed - Completed PHQ-2 or PHQ-9 in the last 360 days      Failed - Valid encounter within last 6 months    Recent Outpatient Visits           1 year ago Uncontrolled type 2 diabetes mellitus with hyperglycemia ([place])   [place] Pickard, Cammie Mcgee, MD   1 year ago Atrial fibrillation, unspecified type [facility])   [place] Susy Frizzle, MD   2 years ago Candidal intertrigo   [place] Susy Frizzle, MD   2 years ago New onset type 2 diabetes mellitus ([place])   [place] Susy Frizzle, MD   3 years ago New onset type 2 diabetes mellitus ([place])   [facility] Pickard, Cammie Mcgee, MD

## 2022-06-12 ENCOUNTER — Other Ambulatory Visit: Payer: Self-pay | Admitting: Family Medicine

## 2022-06-15 ENCOUNTER — Other Ambulatory Visit: Payer: Self-pay | Admitting: Family Medicine

## 2022-06-16 ENCOUNTER — Other Ambulatory Visit: Payer: Self-pay

## 2022-06-16 DIAGNOSIS — E7800 Pure hypercholesterolemia, unspecified: Secondary | ICD-10-CM

## 2022-06-16 MED ORDER — LISINOPRIL 10 MG PO TABS: 10.0000 mg | ORAL_TABLET | Freq: Every day | ORAL | 3 refills | Status: DC

## 2022-07-21 ENCOUNTER — Other Ambulatory Visit: Payer: Self-pay | Admitting: Family Medicine

## 2022-07-21 DIAGNOSIS — I480 Paroxysmal atrial fibrillation: Secondary | ICD-10-CM

## 2022-07-21 NOTE — Telephone Encounter (Signed)
Requested medications are due for refill today.  yes  Requested medications are on the active medications list.  yes  Last refill. 04/14/2022 #60 2 rf  Future visit scheduled.   no  Notes to clinic.  Labs are expired.    Requested Prescriptions  Pending Prescriptions Disp Refills   ELIQUIS 5 MG TABS tablet [Pharmacy Med Name: ELIQUIS 5 MG TABLET] 60 tablet 2    Sig: TAKE 1 TABLET BY MOUTH TWICE A DAY     Hematology:  Anticoagulants - apixaban Failed - 07/21/2022  9:33 AM      Failed - Cr in normal range and within 360 days    Creat  Date Value Ref Range Status  01/31/2021 0.96 0.70 - 1.35 mg/dL Final   Creatinine, Urine  Date Value Ref Range Status  09/17/2019 89 20 - 320 mg/dL Final         Failed - AST in normal range and within 360 days    AST  Date Value Ref Range Status  01/31/2021 14 10 - 35 U/L Final         Failed - ALT in normal range and within 360 days    ALT  Date Value Ref Range Status  01/31/2021 11 9 - 46 U/L Final         Failed - Valid encounter within last 12 months    Recent Outpatient Visits           1 year ago Uncontrolled type 2 diabetes mellitus with hyperglycemia ([place])   [place] Pickard, Cammie Mcgee, MD   1 year ago Atrial fibrillation, unspecified type ([place])   [place] Pickard, Cammie Mcgee, MD   2 years ago Candidal intertrigo   [place] Susy Frizzle, MD   2 years ago New onset type 2 diabetes mellitus ([place])   [place] Susy Frizzle, MD   4 years ago New onset type 2 diabetes mellitus ([place])   [place] Pickard, Cammie Mcgee, MD              Passed - PLT in normal range and within 360 days    Platelets  Date Value Ref Range Status  12/01/2021 186 140 - 400 Thousand/uL Final         Passed - HGB in normal range and within 360 days    Hemoglobin  Date Value Ref Range Status  12/01/2021 16.5 13.2 - 17.1 g/dL Final          Passed - HCT in normal range and within 360 days    HCT  Date Value Ref Range Status  12/01/2021 48.9 38.5 - 50.0 % Final

## 2022-07-21 NOTE — Telephone Encounter (Signed)
Prescription Request  07/21/2022  Is this a "Controlled Substance" medicine? No  LOV: 12/01/2021  What is the name of the medication or equipment? PARoxetine (PAXIL) 30 MG tablet    Have you contacted your pharmacy to request a refill? Yes   Which pharmacy would you like this sent to?  [id number]: [phone]: [phone]   Patient notified that their request is being sent to the clinical staff for review and that they should receive a response within 2 business days.   [phone]

## 2022-08-13 ENCOUNTER — Other Ambulatory Visit: Payer: Self-pay | Admitting: Family Medicine

## 2022-08-14 ENCOUNTER — Other Ambulatory Visit: Payer: Self-pay

## 2022-08-14 NOTE — Telephone Encounter (Signed)
Prescription Request  08/14/2022  Is this a "Controlled Substance" medicine? No  LOV: 12/01/21  What is the name of the medication or equipment? PARoxetine (PAXIL) 30 MG tablet [id number]  Have you contacted your pharmacy to request a refill? Yes   Which pharmacy would you like this sent to?  [id number]: [phone]: [phone]   Patient notified that their request is being sent to the clinical staff for review and that they should receive a response within 2 business days.   Please [facility] at [phone]

## 2022-08-15 MED ORDER — PAROXETINE HCL 30 MG PO TABS: 30.0000 mg | ORAL_TABLET | Freq: Every day | ORAL | 0 refills | Status: DC

## 2022-08-18 ENCOUNTER — Other Ambulatory Visit: Payer: Self-pay | Admitting: Family Medicine

## 2022-09-04 DIAGNOSIS — L814 Other melanin hyperpigmentation: Secondary | ICD-10-CM | POA: Diagnosis not present

## 2022-09-04 DIAGNOSIS — L538 Other specified erythematous conditions: Secondary | ICD-10-CM | POA: Diagnosis not present

## 2022-09-04 DIAGNOSIS — R208 Other disturbances of skin sensation: Secondary | ICD-10-CM | POA: Diagnosis not present

## 2022-09-04 DIAGNOSIS — L570 Actinic keratosis: Secondary | ICD-10-CM | POA: Diagnosis not present

## 2022-09-04 DIAGNOSIS — L918 Other hypertrophic disorders of the skin: Secondary | ICD-10-CM | POA: Diagnosis not present

## 2022-09-04 DIAGNOSIS — L821 Other seborrheic keratosis: Secondary | ICD-10-CM | POA: Diagnosis not present

## 2022-09-04 DIAGNOSIS — D225 Melanocytic nevi of trunk: Secondary | ICD-10-CM | POA: Diagnosis not present

## 2022-09-20 ENCOUNTER — Other Ambulatory Visit: Payer: Self-pay | Admitting: Family Medicine

## 2022-09-21 NOTE — Telephone Encounter (Signed)
Patient needs OV, will refill medication for 30 days until OV can be made. OV needed for additional refills.  Requested Prescriptions  Pending Prescriptions Disp Refills   PARoxetine (PAXIL) 30 MG tablet [Pharmacy Med Name: PAROXETINE HCL 30 MG TABLET] 30 tablet 0    Sig: TAKE 1 TABLET BY MOUTH EVERY DAY     Psychiatry:  Antidepressants - SSRI Failed - 09/20/2022  3:17 PM      Failed - Completed PHQ-2 or PHQ-9 in the last 360 days      Failed - Valid encounter within last 6 months    Recent Outpatient Visits           1 year ago Uncontrolled type 2 diabetes mellitus with hyperglycemia ([place])   [place] Pickard, Cammie Mcgee, MD   1 year ago Atrial fibrillation, unspecified type [facility])   [place] Susy Frizzle, MD   2 years ago Candidal intertrigo   [place] Susy Frizzle, MD   3 years ago New onset type 2 diabetes mellitus ([place])   [place] Susy Frizzle, MD   4 years ago New onset type 2 diabetes mellitus ([place])   [facility] Pickard, Cammie Mcgee, MD

## 2022-10-16 ENCOUNTER — Other Ambulatory Visit: Payer: Self-pay | Admitting: Family Medicine

## 2022-10-16 DIAGNOSIS — I480 Paroxysmal atrial fibrillation: Secondary | ICD-10-CM

## 2022-10-17 ENCOUNTER — Other Ambulatory Visit: Payer: Self-pay | Admitting: Family Medicine

## 2022-10-17 NOTE — Telephone Encounter (Signed)
Requested medication (s) are due for refill today: yes  Requested medication (s) are on the active medication list: yes  Last refill:  09/21/22  Future visit scheduled: no  Notes to clinic:  Unable to refill per protocol, courtesy refill already given, routing for provider approval.      Requested Prescriptions  Pending Prescriptions Disp Refills   PARoxetine (PAXIL) 30 MG tablet [Pharmacy Med Name: PAROXETINE HCL 30 MG TABLET] 90 tablet 1    Sig: TAKE 1 TABLET BY MOUTH EVERY DAY     Psychiatry:  Antidepressants - SSRI Failed - 10/16/2022  9:30 AM      Failed - Completed PHQ-2 or PHQ-9 in the last 360 days      Failed - Valid encounter within last 6 months    Recent Outpatient Visits           1 year ago Uncontrolled type 2 diabetes mellitus with hyperglycemia (HCC)   [facility] Medicine Pickard, Priscille Heidelberg, MD   1 year ago Atrial fibrillation, unspecified type [facility])   [facility] Family Medicine Pickard, Priscille Heidelberg, MD   2 years ago Candidal intertrigo   [facility] Family Medicine Donita Brooks, MD   3 years ago New onset type 2 diabetes mellitus (HCC)   [facility] Family Medicine Donita Brooks, MD   4 years ago New onset type 2 diabetes mellitus (HCC)   [facility] Family Medicine Pickard, Priscille Heidelberg, MD

## 2022-10-17 NOTE — Telephone Encounter (Signed)
Requested medication (s) are due for refill today: Yes  Requested medication (s) are on the active medication list: Yes  Last refill:  07/21/22  Future visit scheduled: No  Notes to clinic:  See request.    Requested Prescriptions  Pending Prescriptions Disp Refills   ELIQUIS 5 MG TABS tablet [Pharmacy Med Name: ELIQUIS 5 MG TABLET] 60 tablet 2    Sig: TAKE 1 TABLET BY MOUTH TWICE A DAY     Hematology:  Anticoagulants - apixaban Failed - 10/16/2022  1:51 AM      Failed - Cr in normal range and within 360 days    Creat  Date Value Ref Range Status  01/31/2021 0.96 0.70 - 1.35 mg/dL Final   Creatinine, Urine  Date Value Ref Range Status  09/17/2019 89 20 - 320 mg/dL Final         Failed - AST in normal range and within 360 days    AST  Date Value Ref Range Status  01/31/2021 14 10 - 35 U/L Final         Failed - ALT in normal range and within 360 days    ALT  Date Value Ref Range Status  01/31/2021 11 9 - 46 U/L Final         Failed - Valid encounter within last 12 months    Recent Outpatient Visits           1 year ago Uncontrolled type 2 diabetes mellitus with hyperglycemia (HCC)   [facility] Family Medicine Pickard, Priscille Heidelberg, MD   1 year ago Atrial fibrillation, unspecified type (HCC)   [facility] Family Medicine Donita Brooks, MD   2 years ago Candidal intertrigo   [facility] Donita Brooks, MD   3 years ago New onset type 2 diabetes mellitus (HCC)   [facility] Family Medicine Donita Brooks, MD   4 years ago New onset type 2 diabetes mellitus (HCC)   [facility] Family Medicine Pickard, Priscille Heidelberg, MD              Passed - PLT in normal range and within 360 days    Platelets  Date Value Ref Range Status  12/01/2021 186 140 - 400 Thousand/uL Final         Passed - HGB in normal range and within 360 days    Hemoglobin  Date Value Ref Range Status  12/01/2021 16.5 13.2 - 17.1 g/dL Final         Passed - HCT in  normal range and within 360 days    HCT  Date Value Ref Range Status  12/01/2021 48.9 38.5 - 50.0 % Final

## 2022-10-18 NOTE — Telephone Encounter (Signed)
Requested medication (s) are due for refill today - yes  Requested medication (s) are on the active medication list -yes  Future visit scheduled -no  Last refill: 08/14/22 #30   Notes to clinic: non delegated Rx  Requested Prescriptions  Pending Prescriptions Disp Refills   ALPRAZolam (XANAX) 1 MG tablet [Pharmacy Med Name: ALPRAZOLAM 1 MG TABLET] 30 tablet 0    Sig: TAKE 1/2 TABLET BY MOUTH UP TO EVERY 8 HOURS AS NEEDED     Not Delegated - Psychiatry: Anxiolytics/Hypnotics 2 Failed - 10/17/2022  6:17 PM      Failed - This refill cannot be delegated      Failed - Urine Drug Screen completed in last 360 days      Failed - Valid encounter within last 6 months    Recent Outpatient Visits           1 year ago Uncontrolled type 2 diabetes mellitus with hyperglycemia (HCC)   [facility] Family Medicine Pickard, Priscille Heidelberg, MD   1 year ago Atrial fibrillation, unspecified type (HCC)   [facility] Family Medicine Pickard, Priscille Heidelberg, MD   2 years ago Candidal intertrigo   [facility] Donita Brooks, MD   3 years ago New onset type 2 diabetes mellitus (HCC)   [facility] Family Medicine Donita Brooks, MD   4 years ago New onset type 2 diabetes mellitus (HCC)   [facility] Pickard, Priscille Heidelberg, MD              Passed - Patient is not pregnant         Requested Prescriptions  Pending Prescriptions Disp Refills   ALPRAZolam (XANAX) 1 MG tablet [Pharmacy Med Name: ALPRAZOLAM 1 MG TABLET] 30 tablet 0    Sig: TAKE 1/2 TABLET BY MOUTH UP TO EVERY 8 HOURS AS NEEDED     Not Delegated - Psychiatry: Anxiolytics/Hypnotics 2 Failed - 10/17/2022  6:17 PM      Failed - This refill cannot be delegated      Failed - Urine Drug Screen completed in last 360 days      Failed - Valid encounter within last 6 months    Recent Outpatient Visits           1 year ago Uncontrolled type 2 diabetes mellitus with hyperglycemia (HCC)   [facility]  Medicine Pickard, Priscille Heidelberg, MD   1 year ago Atrial fibrillation, unspecified type [facility])   [facility] Family Medicine Pickard, Priscille Heidelberg, MD   2 years ago Candidal intertrigo   [facility] Family Medicine Donita Brooks, MD   3 years ago New onset type 2 diabetes mellitus (HCC)   [facility] Family Medicine Donita Brooks, MD   4 years ago New onset type 2 diabetes mellitus (HCC)   [facility] Family Medicine Pickard, Priscille Heidelberg, MD              Passed - Patient is not pregnant

## 2022-10-21 ENCOUNTER — Other Ambulatory Visit: Payer: Self-pay | Admitting: Family Medicine

## 2022-10-23 ENCOUNTER — Telehealth: Payer: Self-pay

## 2022-10-23 ENCOUNTER — Other Ambulatory Visit: Payer: Self-pay

## 2022-10-23 DIAGNOSIS — E7800 Pure hypercholesterolemia, unspecified: Secondary | ICD-10-CM

## 2022-10-23 MED ORDER — LISINOPRIL 10 MG PO TABS
10.0000 mg | ORAL_TABLET | Freq: Every day | ORAL | 0 refills | Status: DC
Start: 2022-10-23 — End: 2023-02-23

## 2022-10-23 MED ORDER — LISINOPRIL 10 MG PO TABS
10.0000 mg | ORAL_TABLET | Freq: Every day | ORAL | 2 refills | Status: DC
Start: 2022-10-23 — End: 2022-10-23

## 2022-10-23 NOTE — Telephone Encounter (Signed)
Prescription Request  10/23/2022  LOV: 12/01/21  What is the name of the medication or equipment? lisinopril (ZESTRIL) 10 MG tablet [[id number]]  Have you contacted your pharmacy to request a refill? Yes   Which pharmacy would you like this sent to?  [id number] [place], [place] - [address] [place] [address] Phone: [phone] Fax: [phone]    Patient notified that their request is being sent to the clinical staff for review and that they should receive a response within 2 business days.   Please advise at [facility] [phone]

## 2022-10-23 NOTE — Addendum Note (Signed)
Addended by: Arta Silence on: 10/23/2022 12:53 PM   Modules accepted: Orders

## 2022-12-06 ENCOUNTER — Ambulatory Visit: Payer: 59 | Admitting: Family

## 2022-12-06 ENCOUNTER — Encounter: Payer: Self-pay | Admitting: Family

## 2022-12-06 DIAGNOSIS — Z981 Arthrodesis status: Secondary | ICD-10-CM

## 2022-12-06 NOTE — Progress Notes (Signed)
Office Visit Note   Patient: Steven Klein           Date of Birth: 01/07/1961           MRN: [id number] Visit Date: 12/06/2022              Requested by: Donita Brooks, MD [address] [place],  [place] [address] PCP: Donita Brooks, MD  No chief complaint on file.     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  His current AFO is a double upright brace the metal has broken.  He is in need of a new brace  Is currently wearing an AFO that he has borrowed from family  No concerns of wounds.  His chronic level of pain which increases over the course the day aching pain mild edema.  No new concerns.  Assessment & Plan: Visit Diagnoses: No diagnosis found.  Plan: Given an order for new AFO on the left he will follow-up with [facility] for fabrication follow-up in the office as needed  Follow-Up Instructions: Return if symptoms worsen or fail to improve.   Ortho Exam  Patient is alert, oriented, no adenopathy, well-dressed, normal affect, normal respiratory effort. On examination of the left lower extremity well-healed surgical scars.  Trace edema there is no erythema no impending ulceration. ankle fixed  Imaging: No results found.   Labs: Lab Results  Component Value Date   HGBA1C 6.0 (H) 12/01/2021   HGBA1C 6.4 (H) 01/31/2021   HGBA1C 9.4 (H) 10/25/2020     Lab Results  Component Value Date   ALBUMIN 4.0 01/01/2017   ALBUMIN 3.6 10/13/2015   ALBUMIN 4.0 04/06/2015    No results found for: "MG" No results found for: "VD25OH"  No results found for: "PREALBUMIN"    Latest Ref Rng & Units 12/01/2021    8:36 AM 10/25/2020    8:23 AM 09/17/2019    8:12 AM  CBC EXTENDED  WBC 3.8 - 10.8 Thousand/uL 8.7  9.8  8.6   RBC 4.20 - 5.80 Million/uL 5.46  5.70  5.50   Hemoglobin 13.2 - 17.1 g/dL 62.1  30.8  65.7   HCT 38.5 - 50.0 % 48.9   50.9  49.8   Platelets 140 - 400 Thousand/uL 186  250  222   NEUT# 1,500 - 7,800 cells/uL 5,585  6,350  5,091   Lymph# 850 - 3,900 cells/uL 2,018  2,558  2,468      There is no height or weight on file to calculate BMI.  Orders:  No orders of the defined types were placed in this encounter.  No orders of the defined types were placed in this encounter.    Procedures: No procedures performed  Clinical Data: No additional findings.  ROS:  All other systems negative, except as noted in the HPI. Review of Systems  Objective: Vital Signs: There were no vitals taken for this visit.  Specialty Comments:  No specialty comments available.  PMFS History: Patient Active Problem List   Diagnosis Date Noted   HLD (hyperlipidemia) 05/06/2010   METABOLIC SYNDROME X 05/06/2010   PANIC ATTACK 05/06/2010   DEPRESSION/ANXIETY 05/06/2010   ATRIAL FIBRILLATION 05/06/2010   CARDIOMEGALY 05/06/2010   Past Medical History:  Diagnosis Date   Adenomatous colon polyp    Anxiety and depression    Atrial  fibrillation (HCC)    Cardiomegaly    Depression    Diabetes mellitus without complication (HCC)    Hyperlipidemia    Metabolic syndrome X    Panic attack     Family History  Adopted: Yes  Problem Relation Age of Onset   Colon cancer Neg Hx    Esophageal cancer Neg Hx    Stomach cancer Neg Hx     Past Surgical History:  Procedure Laterality Date   COLONOSCOPY W/ POLYPECTOMY     ORIF ANKLE FRACTURE     Social History   Occupational History   Occupation: OWNER    Employer: [employer]    Comment: lawn care  Tobacco Use   Smoking status: Every Day   Smokeless tobacco: Never  Substance and Sexual Activity   Alcohol use: No    Alcohol/week: 0.0 standard drinks of alcohol   Drug use: No   Sexual activity: Not on file

## 2022-12-16 ENCOUNTER — Other Ambulatory Visit: Payer: Self-pay | Admitting: Family Medicine

## 2023-01-05 DIAGNOSIS — M21372 Foot drop, left foot: Secondary | ICD-10-CM | POA: Diagnosis not present

## 2023-01-14 ENCOUNTER — Other Ambulatory Visit: Payer: Self-pay | Admitting: Family Medicine

## 2023-01-14 DIAGNOSIS — I480 Paroxysmal atrial fibrillation: Secondary | ICD-10-CM

## 2023-01-23 ENCOUNTER — Telehealth: Payer: Self-pay

## 2023-01-23 NOTE — Telephone Encounter (Signed)
Reached out to patient to set up follow up visit with provider to discuss chronic conditions.  Telephone encounter attempt : 1   A HIPAA compliant voice message was left requesting a return call.  Instructed patient to call office or to call me at [phone].  Elijio [facility] Specialist

## 2023-01-24 ENCOUNTER — Other Ambulatory Visit: Payer: Self-pay | Admitting: Family Medicine

## 2023-01-25 NOTE — Telephone Encounter (Signed)
Requested medication (s) are due for refill today: Yes  Requested medication (s) are on the active medication list: Yes  Last refill:  12/18/22 #30, 0RF  Future visit scheduled: No  Notes to clinic:  Unable to refill per protocol, cannot delegate. Unable to refill per protocol, appointment needed.      Requested Prescriptions  Pending Prescriptions Disp Refills   ALPRAZolam (XANAX) 1 MG tablet [Pharmacy Med Name: ALPRAZOLAM 1 MG TABLET] 30 tablet 0    Sig: TAKE 1/2 TABLET BY MOUTH UP TO EVERY 8 HOURS AS NEEDED     Not Delegated - Psychiatry: Anxiolytics/Hypnotics 2 Failed - 01/24/2023 12:31 PM      Failed - This refill cannot be delegated      Failed - Urine Drug Screen completed in last 360 days      Failed - Valid encounter within last 6 months    Recent Outpatient Visits           1 year ago Uncontrolled type 2 diabetes mellitus with hyperglycemia (HCC)   [facility] Medicine Donita Brooks, MD   2 years ago Atrial fibrillation, unspecified type [facility])   [facility] Family Medicine Pickard, Priscille Heidelberg, MD   2 years ago Candidal intertrigo   [facility] Family Medicine Donita Brooks, MD   3 years ago New onset type 2 diabetes mellitus (HCC)   [facility] Medicine Donita Brooks, MD   4 years ago New onset type 2 diabetes mellitus (HCC)   [facility] Family Medicine Pickard, Priscille Heidelberg, MD              Passed - Patient is not pregnant

## 2023-02-06 ENCOUNTER — Telehealth: Payer: Self-pay

## 2023-02-06 NOTE — Telephone Encounter (Signed)
 Reached out to patient to set up follow up visit with provider to discuss chronic conditions.  Telephone encounter attempt : 2   No answer  Steven Klein [facility] Specialist

## 2023-02-22 ENCOUNTER — Other Ambulatory Visit: Payer: Self-pay | Admitting: Family Medicine

## 2023-02-22 DIAGNOSIS — I480 Paroxysmal atrial fibrillation: Secondary | ICD-10-CM

## 2023-02-23 ENCOUNTER — Other Ambulatory Visit: Payer: Self-pay | Admitting: Family Medicine

## 2023-02-23 DIAGNOSIS — E7800 Pure hypercholesterolemia, unspecified: Secondary | ICD-10-CM

## 2023-02-23 NOTE — Telephone Encounter (Signed)
Requested medication (s) are due for refill today: Yes  Requested medication (s) are on the active medication list: Yes  Last refill:  01/15/23  Future visit scheduled: No  Notes to clinic:  Protocol indicates lab work need, OV needed.    Requested Prescriptions  Pending Prescriptions Disp Refills   ELIQUIS 5 MG TABS tablet [Pharmacy Med Name: ELIQUIS 5 MG TABLET] 60 tablet 0    Sig: TAKE 1 TABLET BY MOUTH TWICE A DAY     Hematology:  Anticoagulants - apixaban Failed - 02/22/2023  9:38 AM      Failed - PLT in normal range and within 360 days    Platelets  Date Value Ref Range Status  12/01/2021 186 140 - 400 Thousand/uL Final         Failed - HGB in normal range and within 360 days    Hemoglobin  Date Value Ref Range Status  12/01/2021 16.5 13.2 - 17.1 g/dL Final         Failed - HCT in normal range and within 360 days    HCT  Date Value Ref Range Status  12/01/2021 48.9 38.5 - 50.0 % Final         Failed - Cr in normal range and within 360 days    Creat  Date Value Ref Range Status  01/31/2021 0.96 0.70 - 1.35 mg/dL Final   Creatinine, Urine  Date Value Ref Range Status  09/17/2019 89 20 - 320 mg/dL Final         Failed - AST in normal range and within 360 days    AST  Date Value Ref Range Status  01/31/2021 14 10 - 35 U/L Final         Failed - ALT in normal range and within 360 days    ALT  Date Value Ref Range Status  01/31/2021 11 9 - 46 U/L Final         Failed - Valid encounter within last 12 months    Recent Outpatient Visits           2 years ago Uncontrolled type 2 diabetes mellitus with hyperglycemia (HCC)   [facility] Family Medicine Donita Brooks, MD   2 years ago Atrial fibrillation, unspecified type [facility])   [facility] Family Medicine Donita Brooks, MD   2 years ago Candidal intertrigo   [facility] Family Medicine Donita Brooks, MD   3 years ago New onset type 2 diabetes mellitus (HCC)   [facility] Family Medicine  Donita Brooks, MD   4 years ago New onset type 2 diabetes mellitus (HCC)   [facility] Family Medicine Pickard, Priscille Heidelberg, MD

## 2023-02-23 NOTE — Telephone Encounter (Signed)
Prescription Request  02/23/2023  LOV: Visit date not found  What is the name of the medication or equipment? lisinopril (ZESTRIL) 10 MG tablet   Have you contacted your pharmacy to request a refill? Yes   Which pharmacy would you like this sent to?  [id number] [place], [place] - [address] [place] [address] Phone: [phone] Fax: [phone]    Patient notified that their request is being sent to the clinical staff for review and that they should receive a response within 2 business days.   Please advise at [facility] [phone]

## 2023-02-26 MED ORDER — LISINOPRIL 10 MG PO TABS
10.0000 mg | ORAL_TABLET | Freq: Every day | ORAL | 0 refills | Status: DC
Start: 2023-02-26 — End: 2023-06-27

## 2023-02-26 NOTE — Telephone Encounter (Signed)
Future OV scheduled 02/27/23.  Requested Prescriptions  Pending Prescriptions Disp Refills   lisinopril (ZESTRIL) 10 MG tablet 90 tablet 0    Sig: Take 1 tablet (10 mg total) by mouth daily.     Cardiovascular:  ACE Inhibitors Failed - 02/23/2023  1:41 PM      Failed - Cr in normal range and within 180 days    Creat  Date Value Ref Range Status  01/31/2021 0.96 0.70 - 1.35 mg/dL Final   Creatinine, Urine  Date Value Ref Range Status  09/17/2019 89 20 - 320 mg/dL Final         Failed - K in normal range and within 180 days    Potassium  Date Value Ref Range Status  01/31/2021 4.3 3.5 - 5.3 mmol/L Final         Failed - Last BP in normal range    BP Readings from Last 1 Encounters:  12/01/21 140/90         Failed - Valid encounter within last 6 months    Recent Outpatient Visits           2 years ago Uncontrolled type 2 diabetes mellitus with hyperglycemia (HCC)   [facility] Medicine Donita Brooks, MD   2 years ago Atrial fibrillation, unspecified type [facility])   [facility] Pickard, Priscille Heidelberg, MD   2 years ago Candidal intertrigo   [facility] Donita Brooks, MD   3 years ago New onset type 2 diabetes mellitus (HCC)   [facility] Donita Brooks, MD   4 years ago New onset type 2 diabetes mellitus (HCC)   [facility] Family Medicine Pickard, Priscille Heidelberg, MD       Future Appointments             Tomorrow Pickard, Priscille Heidelberg, MD [facility] Family Medicine, [facility]            Passed - Patient is not pregnant

## 2023-02-27 ENCOUNTER — Telehealth: Payer: Self-pay

## 2023-02-27 ENCOUNTER — Ambulatory Visit: Payer: 59 | Admitting: Family Medicine

## 2023-02-27 ENCOUNTER — Encounter: Payer: Self-pay | Admitting: Family Medicine

## 2023-02-27 VITALS — BP 126/78 | HR 66 | Temp 98.7°F | Ht 66.0 in | Wt 260.0 lb

## 2023-02-27 DIAGNOSIS — Z7984 Long term (current) use of oral hypoglycemic drugs: Secondary | ICD-10-CM

## 2023-02-27 DIAGNOSIS — I4891 Unspecified atrial fibrillation: Secondary | ICD-10-CM | POA: Diagnosis not present

## 2023-02-27 DIAGNOSIS — E118 Type 2 diabetes mellitus with unspecified complications: Secondary | ICD-10-CM | POA: Diagnosis not present

## 2023-02-27 DIAGNOSIS — E7800 Pure hypercholesterolemia, unspecified: Secondary | ICD-10-CM

## 2023-02-27 DIAGNOSIS — Z122 Encounter for screening for malignant neoplasm of respiratory organs: Secondary | ICD-10-CM

## 2023-02-27 MED ORDER — ALBUTEROL SULFATE HFA 108 (90 BASE) MCG/ACT IN AERS: 2.0000 | INHALATION_SPRAY | Freq: Four times a day (QID) | RESPIRATORY_TRACT | 0 refills | Status: AC | PRN

## 2023-02-27 MED ORDER — PREDNISONE 20 MG PO TABS: ORAL_TABLET | ORAL | 0 refills | Status: DC

## 2023-02-27 MED ORDER — FLUTICASONE PROPIONATE 50 MCG/ACT NA SUSP: 2.0000 | Freq: Every day | NASAL | 6 refills | Status: DC

## 2023-02-27 NOTE — Progress Notes (Signed)
Subjective:    Patient ID: Steven Klein, male    DOB: 1960/09/05, 62 y.o.   MRN: [id number]  HPI  I last saw the patient in 2023.  Since I last saw the patient, his wife died from Fournier's gangrene.  Overdue for  follow up of his diabetes.  His shingles vaccine is up-to-date.  His pneumonia vaccine is up-to-date.  He is occasionally checking his blood sugars.  They are typically 90-120.  He denies any chest pain.  He does occasionally get wheezing and cough and shortness of breath.  This typically occurs when he is weed eating.  It also typically occurs when it is extremely hot and humid outside.  He has a longstanding history of hypothyroidism.  Denies any hemoptysis.  He denies any angina.  He denies any orthopnea or paroxysmal nocturnal dyspnea Past Medical History:  Diagnosis Date   Adenomatous colon polyp    Anxiety and depression    Atrial fibrillation (HCC)    Cardiomegaly    Depression    Diabetes mellitus without complication (HCC)    Hyperlipidemia    Metabolic syndrome X    Panic attack    Past Surgical History:  Procedure Laterality Date   COLONOSCOPY W/ POLYPECTOMY     ORIF ANKLE FRACTURE     Current Outpatient Medications on File Prior to Visit  Medication Sig Dispense Refill   ALPRAZolam (XANAX) 1 MG tablet TAKE 1/2 TABLET BY MOUTH UP TO EVERY 8 HOURS AS NEEDED 30 tablet 0   apixaban (ELIQUIS) 5 MG TABS tablet TAKE 1 TABLET BY MOUTH TWICE A DAY 60 tablet 0   blood glucose meter kit and supplies Dispense based on patient and insurance preference. Use up to 3 times as directed. (FOR ICD-10 E10.9, E11.9). 1 each 0   Blood Glucose Monitoring Suppl (ACCU-CHEK GUIDE ME) w/Device KIT 1 strip by Does not apply route 2 (two) times daily. (Check BS BID - DX E11.9) 1 kit 2   glucose blood (ACCU-CHEK GUIDE) test strip CHECK BLOOD SUGAR TWICE A DAY DX: E11.9 100 strip 0   Lancets MISC Please dispense based on patient and insurance preference. Use as directed to monitor FSBS 2x  daily. Dx: E11.9 100 each 3   lisinopril (ZESTRIL) 10 MG tablet Take 1 tablet (10 mg total) by mouth daily. 90 tablet 0   metFORMIN (GLUCOPHAGE-XR) 500 MG 24 hr tablet TAKE 2 TABLETS BY MOUTH EVERY DAY WITH BREAKFAST 180 tablet 2   metoprolol succinate (TOPROL-XL) 50 MG 24 hr tablet TAKE 1 TABLET BY MOUTH EVERY DAY WITH OR IMMEDIATELY FOLLOWING A MEAL 30 tablet 5   PARoxetine (PAXIL) 30 MG tablet TAKE 1 TABLET BY MOUTH EVERY DAY 90 tablet 1   pioglitazone (ACTOS) 30 MG tablet TAKE 1 TABLET BY MOUTH EVERY DAY 30 tablet 11   simvastatin (ZOCOR) 40 MG tablet TAKE 1 TABLET BY MOUTH EVERY DAY 30 tablet 5   albuterol (VENTOLIN HFA) 108 (90 Base) MCG/ACT inhaler SMARTSIG:1 Puff(s) Via Inhaler Every 4 Hours PRN (Patient not taking: Reported on 02/27/2023)     ofloxacin (OCUFLOX) 0.3 % ophthalmic solution SMARTSIG:In Eye(s)     No current facility-administered medications on file prior to visit.     Allergies  Allergen Reactions   Sulfamethoxazole     Tongue swells   Ezetimibe-Simvastatin     Does not remember   Sulfonamide Derivatives     REACTION: breathing problems   Social History   Socioeconomic History   Marital status:  Married    Spouse name: Not on file   Number of children: 1   Years of education: Not on file   Highest education level: Not on file  Occupational History   Occupation: OWNER    Employer: [employer]    Comment: lawn care  Tobacco Use   Smoking status: Every Day   Smokeless tobacco: Never  Substance and Sexual Activity   Alcohol use: No    Alcohol/week: 0.0 standard drinks of alcohol   Drug use: No   Sexual activity: Not on file  Other Topics Concern   Not on file  Social History Narrative   Not on file   Social Determinants of Health   Financial Resource Strain: Not on file  Food Insecurity: Not on file  Transportation Needs: Not on file  Physical Activity: Not on file  Stress: Not on file  Social Connections: Not on file  Intimate  Partner Violence: Not on file     Review of Systems  All other systems reviewed and are negative.      Objective:   Physical Exam Vitals reviewed.  Constitutional:      General: He is not in acute distress.    Appearance: He is well-developed. He is not diaphoretic.  HENT:     Right Ear: External ear normal.     Left Ear: External ear normal.     Nose: Congestion and rhinorrhea present.     Mouth/Throat:     Pharynx: No oropharyngeal exudate.  Eyes:     Conjunctiva/sclera: Conjunctivae normal.  Neck:     Thyroid: No thyromegaly.  Cardiovascular:     Rate and Rhythm: Normal rate. Rhythm irregular.     Heart sounds: Normal heart sounds. No murmur heard.    No friction rub. No gallop.  Pulmonary:     Effort: Pulmonary effort is normal. No respiratory distress.     Breath sounds: Wheezing present. No rales.  Abdominal:     General: Bowel sounds are normal. There is no distension.     Palpations: Abdomen is soft. There is no mass.     Tenderness: There is no abdominal tenderness. There is no guarding or rebound.  Musculoskeletal:     Cervical back: Neck supple.  Lymphadenopathy:     Cervical: No cervical adenopathy.  Neurological:     Mental Status: He is alert and oriented to person, place, and time.     Cranial Nerves: No cranial nerve deficit.     Motor: No abnormal muscle tone.     Coordination: Coordination normal.     Deep Tendon Reflexes: Reflexes are normal and symmetric.           Assessment & Plan:   Controlled type 2 diabetes mellitus with complication, without long-term current use of insulin (HCC) - Plan: Hemoglobin A1c, CBC with Differential/Platelet, COMPLETE METABOLIC PANEL WITH GFR, Lipid panel, Protein / Creatinine Ratio, Urine  Pure hypercholesterolemia  Atrial fibrillation, unspecified type (HCC)  Screening for lung cancer - Plan: CT CHEST LUNG CA SCREEN LOW DOSE W/O CM Patient appears to be suffering from severe allergies as well as  bronchospasms.  I recommended albuterol 2 puffs every 4-6 hours as needed for wheezing.  Strongly recommended smoking cessation.  Use a prednisone taper pack for allergic rhinosinusitis.  Then switch to Flonase 2 sprays each nostril daily.  Check CBC CMP A1c and lipid panel.  Goal LDL cholesterol is less than 100 goal A1c is less than 6.5

## 2023-02-27 NOTE — Telephone Encounter (Signed)
Pt was in to pcp today, but forgot to ask for refills of some meds. Pt states that he is completely out of this med apixaban (ELIQUIS) 5 MG TABS tablet [[id number]]. Pt stated that there are a few more meds but he did not have the names of those meds. Pt would like a cb from nurse to clarify what he needed refilled. Please advise.  Cb#: [phone]

## 2023-02-27 NOTE — Telephone Encounter (Signed)
Called patient and left message for patient to call office

## 2023-02-28 ENCOUNTER — Other Ambulatory Visit: Payer: Self-pay | Admitting: Family Medicine

## 2023-02-28 DIAGNOSIS — I480 Paroxysmal atrial fibrillation: Secondary | ICD-10-CM

## 2023-02-28 LAB — HEMOGLOBIN A1C
Hgb A1c MFr Bld: 6.7 %{Hb} — ABNORMAL HIGH (ref <5.7)
Mean Plasma Glucose: 146 mg/dL
eAG (mmol/L): 8.1 mmol/L

## 2023-02-28 LAB — CBC WITH DIFFERENTIAL/PLATELET
Absolute Monocytes: 821 {cells}/uL (ref 200–950)
Basophils Absolute: 68 {cells}/uL (ref 0–200)
Basophils Relative: 0.6 %
Eosinophils Absolute: 205 {cells}/uL (ref 15–500)
Eosinophils Relative: 1.8 %
HCT: 45.1 % (ref 38.5–50.0)
Hemoglobin: 15.4 g/dL (ref 13.2–17.1)
Lymphs Abs: 2348 {cells}/uL (ref 850–3900)
MCH: 31.1 pg (ref 27.0–33.0)
MCHC: 34.1 g/dL (ref 32.0–36.0)
MCV: 91.1 fL (ref 80.0–100.0)
MPV: 11.2 fL (ref 7.5–12.5)
Monocytes Relative: 7.2 %
Neutro Abs: 7957 {cells}/uL — ABNORMAL HIGH (ref 1500–7800)
Neutrophils Relative %: 69.8 %
Platelets: 225 10*3/uL (ref 140–400)
RBC: 4.95 10*6/uL (ref 4.20–5.80)
RDW: 12.5 % (ref 11.0–15.0)
Total Lymphocyte: 20.6 %
WBC: 11.4 10*3/uL — ABNORMAL HIGH (ref 3.8–10.8)

## 2023-02-28 LAB — COMPLETE METABOLIC PANEL WITH GFR
AG Ratio: 1.6 (calc) (ref 1.0–2.5)
ALT: 17 U/L (ref 9–46)
AST: 19 U/L (ref 10–35)
Albumin: 4.2 g/dL (ref 3.6–5.1)
Alkaline phosphatase (APISO): 91 U/L (ref 35–144)
BUN: 10 mg/dL (ref 7–25)
CO2: 27 mmol/L (ref 20–32)
Calcium: 9.6 mg/dL (ref 8.6–10.3)
Chloride: 104 mmol/L (ref 98–110)
Creat: 0.89 mg/dL (ref 0.70–1.35)
Globulin: 2.7 g/dL (ref 1.9–3.7)
Glucose, Bld: 100 mg/dL — ABNORMAL HIGH (ref 65–99)
Potassium: 4.2 mmol/L (ref 3.5–5.3)
Sodium: 141 mmol/L (ref 135–146)
Total Bilirubin: 1.2 mg/dL (ref 0.2–1.2)
Total Protein: 6.9 g/dL (ref 6.1–8.1)
eGFR: 97 mL/min/{1.73_m2} (ref >=60)

## 2023-02-28 LAB — PROTEIN / CREATININE RATIO, URINE
Creatinine, Urine: 230 mg/dL (ref 20–320)
Protein/Creat Ratio: 83 mg/g{creat} (ref 25–148)
Protein/Creatinine Ratio: 0.083 mg/mg{creat} (ref 0.025–0.148)
Total Protein, Urine: 19 mg/dL (ref 5–25)

## 2023-02-28 LAB — LIPID PANEL
Cholesterol: 144 mg/dL (ref <200)
HDL: 40 mg/dL (ref >=40)
LDL Cholesterol (Calc): 75 mg/dL
Non-HDL Cholesterol (Calc): 104 mg/dL (ref <130)
Total CHOL/HDL Ratio: 3.6 (calc) (ref <5.0)
Triglycerides: 197 mg/dL — ABNORMAL HIGH (ref <150)

## 2023-03-01 ENCOUNTER — Other Ambulatory Visit: Payer: Self-pay | Admitting: Family Medicine

## 2023-03-01 DIAGNOSIS — I480 Paroxysmal atrial fibrillation: Secondary | ICD-10-CM

## 2023-03-01 MED ORDER — APIXABAN 5 MG PO TABS
5.0000 mg | ORAL_TABLET | Freq: Two times a day (BID) | ORAL | 11 refills | Status: DC
Start: 2023-03-01 — End: 2024-03-11

## 2023-03-01 MED ORDER — ALPRAZOLAM 1 MG PO TABS: ORAL_TABLET | ORAL | 0 refills | Status: DC

## 2023-03-01 NOTE — Telephone Encounter (Signed)
See MyChart message

## 2023-03-05 ENCOUNTER — Ambulatory Visit
Admission: RE | Admit: 2023-03-05 | Discharge: 2023-03-05 | Disposition: A | Discharge status: Home / Self Care | Payer: 59 | Source: Ambulatory Visit | Attending: Family Medicine | Admitting: Family Medicine

## 2023-03-05 DIAGNOSIS — Z122 Encounter for screening for malignant neoplasm of respiratory organs: Secondary | ICD-10-CM

## 2023-03-05 DIAGNOSIS — Z87891 Personal history of nicotine dependence: Secondary | ICD-10-CM | POA: Diagnosis not present

## 2023-03-07 DIAGNOSIS — B079 Viral wart, unspecified: Secondary | ICD-10-CM | POA: Diagnosis not present

## 2023-03-07 DIAGNOSIS — D492 Neoplasm of unspecified behavior of bone, soft tissue, and skin: Secondary | ICD-10-CM | POA: Diagnosis not present

## 2023-03-07 DIAGNOSIS — L570 Actinic keratosis: Secondary | ICD-10-CM | POA: Diagnosis not present

## 2023-03-12 ENCOUNTER — Other Ambulatory Visit: Payer: Self-pay | Admitting: Family Medicine

## 2023-03-13 NOTE — Telephone Encounter (Signed)
Requested Prescriptions  Pending Prescriptions Disp Refills   glucose blood (ACCU-CHEK GUIDE) test strip [Pharmacy Med Name: ACCU-CHEK GUIDE TEST STRIP] 100 strip 0    Sig: CHECK BLOOD SUGAR TWICE A DAY DX: E11.9 (NEED OFFICE VISIT)     Endocrinology: Diabetes - Testing Supplies Failed - 03/12/2023  1:15 AM      Failed - Valid encounter within last 12 months    Recent Outpatient Visits           2 years ago Uncontrolled type 2 diabetes mellitus with hyperglycemia (HCC)   [facility] Donita Brooks, MD   2 years ago Atrial fibrillation, unspecified type [facility])   [facility] Pickard, Priscille Heidelberg, MD   2 years ago Candidal intertrigo   [facility] Donita Brooks, MD   3 years ago New onset type 2 diabetes mellitus (HCC)   [facility] Donita Brooks, MD   4 years ago New onset type 2 diabetes mellitus (HCC)   [facility] Pickard, Priscille Heidelberg, MD       Future Appointments             In 4 months Pickard, Priscille Heidelberg, MD [facility], [facility]

## 2023-03-15 ENCOUNTER — Encounter: Payer: Self-pay | Admitting: Family Medicine

## 2023-03-19 ENCOUNTER — Other Ambulatory Visit: Payer: Self-pay | Admitting: Family Medicine

## 2023-03-19 NOTE — Telephone Encounter (Signed)
03/16/23 called radiology, spoke w/radiology tech  to pls have pt's CT read and send over to pcp so pt could get results.   Radiology tech is aware and will get CT read and send over.

## 2023-03-20 NOTE — Telephone Encounter (Signed)
Due to a system glitch the protocol does not detect the last office visit for this practice correctly.   Labs in date and LOV was 02/27/2023.   Has upcoming appt 08/02/2023.  Requested Prescriptions  Pending Prescriptions Disp Refills   metFORMIN (GLUCOPHAGE-XR) 500 MG 24 hr tablet [Pharmacy Med Name: METFORMIN HCL ER 500 MG TABLET] 180 tablet 1    Sig: TAKE 2 TABLETS BY MOUTH EVERY DAY WITH BREAKFAST     Endocrinology:  Diabetes - Biguanides Failed - 03/19/2023  1:31 AM      Failed - B12 Level in normal range and within 720 days    No results found for: "VITAMINB12"       Failed - Valid encounter within last 6 months    Recent Outpatient Visits           2 years ago Uncontrolled type 2 diabetes mellitus with hyperglycemia (HCC)   [facility] Pickard, Priscille Heidelberg, MD   2 years ago Atrial fibrillation, unspecified type [facility])   [facility] Medicine Pickard, Priscille Heidelberg, MD   3 years ago Candidal intertrigo   [facility] Donita Brooks, MD   3 years ago New onset type 2 diabetes mellitus (HCC)   [facility] Medicine Pickard, Priscille Heidelberg, MD   4 years ago New onset type 2 diabetes mellitus (HCC)   [facility] Medicine Pickard, Priscille Heidelberg, MD       Future Appointments             In 4 months Pickard, Priscille Heidelberg, MD [facility], [facility]            Passed - Cr in normal range and within 360 days    Creat  Date Value Ref Range Status  02/27/2023 0.89 0.70 - 1.35 mg/dL Final   Creatinine, Urine  Date Value Ref Range Status  02/27/2023 230 20 - 320 mg/dL Final         Passed - HBA1C is between 0 and 7.9 and within 180 days    Hgb A1c MFr Bld  Date Value Ref Range Status  02/27/2023 6.7 (H) <5.7 % of total Hgb Final    Comment:    For someone without known diabetes, a hemoglobin A1c value of 6.5% or greater indicates that they may have  diabetes and this should be confirmed with a follow-up   test. . For someone with known diabetes, a value <7% indicates  that their diabetes is well controlled and a value  greater than or equal to 7% indicates suboptimal  control. A1c targets should be individualized based on  duration of diabetes, age, comorbid conditions, and  other considerations. . Currently, no consensus exists regarding use of hemoglobin A1c for diagnosis of diabetes for children. .          Passed - eGFR in normal range and within 360 days    GFR, Est African American  Date Value Ref Range Status  10/25/2020 107 > OR = 60 mL/min/1.57m2 Final   GFR, Est Non African American  Date Value Ref Range Status  10/25/2020 93 > OR = 60 mL/min/1.69m2 Final   eGFR  Date Value Ref Range Status  02/27/2023 97 > OR = 60 mL/min/1.28m2 Final         Passed - CBC within normal limits and completed in the last 12 months    WBC  Date Value Ref Range Status  02/27/2023 11.4 (H)  3.8 - 10.8 Thousand/uL Final   RBC  Date Value Ref Range Status  02/27/2023 4.95 4.20 - 5.80 Million/uL Final   Hemoglobin  Date Value Ref Range Status  02/27/2023 15.4 13.2 - 17.1 g/dL Final   HCT  Date Value Ref Range Status  02/27/2023 45.1 38.5 - 50.0 % Final   MCHC  Date Value Ref Range Status  02/27/2023 34.1 32.0 - 36.0 g/dL Final   [facility]  Date Value Ref Range Status  02/27/2023 31.1 27.0 - 33.0 pg Final   MCV  Date Value Ref Range Status  02/27/2023 91.1 80.0 - 100.0 fL Final   No results found for: "PLTCOUNTKUC", "LABPLAT", "POCPLA" RDW  Date Value Ref Range Status  02/27/2023 12.5 11.0 - 15.0 % Final

## 2023-04-03 ENCOUNTER — Other Ambulatory Visit: Payer: Self-pay | Admitting: Family Medicine

## 2023-04-04 NOTE — Telephone Encounter (Signed)
Requested Prescriptions  Pending Prescriptions Disp Refills   PARoxetine (PAXIL) 30 MG tablet [Pharmacy Med Name: PAROXETINE HCL 30 MG TABLET] 90 tablet 1    Sig: TAKE 1 TABLET BY MOUTH EVERY DAY     Psychiatry:  Antidepressants - SSRI Failed - 04/03/2023  5:52 PM      Failed - Valid encounter within last 6 months    Recent Outpatient Visits           2 years ago Uncontrolled type 2 diabetes mellitus with hyperglycemia (HCC)   [facility] Pickard, Priscille Heidelberg, MD   2 years ago Atrial fibrillation, unspecified type [facility])   [facility] Pickard, Priscille Heidelberg, MD   3 years ago Candidal intertrigo   [facility] Donita Brooks, MD   3 years ago New onset type 2 diabetes mellitus (HCC)   [facility] Donita Brooks, MD   4 years ago New onset type 2 diabetes mellitus (HCC)   [facility] Pickard, Priscille Heidelberg, MD       Future Appointments             In 4 months Pickard, Priscille Heidelberg, MD [facility], [facility]            Passed - Completed PHQ-2 or PHQ-9 in the last 360 days

## 2023-05-24 ENCOUNTER — Other Ambulatory Visit: Payer: Self-pay | Admitting: Family Medicine

## 2023-05-24 NOTE — Telephone Encounter (Signed)
Prescription Request  05/24/2023  LOV: 02/27/2023  What is the name of the medication or equipment? simvastatin (ZOCOR) 40 MG tablet   Have you contacted your pharmacy to request a refill? Yes   Which pharmacy would you like this sent to?  [id number] [place], [place] - [address] [place] [address] Phone: [phone] Fax: [phone]    Patient notified that their request is being sent to the clinical staff for review and that they should receive a response within 2 business days.   Please advise at [facility] [phone]

## 2023-05-24 NOTE — Telephone Encounter (Signed)
Prescription Request  05/24/2023  LOV: 02/27/2023  What is the name of the medication or equipment? metoprolol succinate (TOPROL-XL) 50 MG 24 hr tablet   Have you contacted your pharmacy to request a refill? Yes   Which pharmacy would you like this sent to?  [id number] [place], [place] - [address] [place] [address] Phone: [phone] Fax: [phone]    Patient notified that their request is being sent to the clinical staff for review and that they should receive a response within 2 business days.   Please advise at [facility] [phone]

## 2023-05-25 MED ORDER — SIMVASTATIN 40 MG PO TABS: 40.0000 mg | ORAL_TABLET | Freq: Every day | ORAL | 7 refills | Status: DC

## 2023-05-25 MED ORDER — METOPROLOL SUCCINATE ER 50 MG PO TB24: 50.0000 mg | ORAL_TABLET | Freq: Every day | ORAL | 2 refills | Status: DC

## 2023-05-25 NOTE — Telephone Encounter (Signed)
Requested Prescriptions  Pending Prescriptions Disp Refills   metoprolol succinate (TOPROL-XL) 50 MG 24 hr tablet 30 tablet 2    Sig: 1 tablet (50 mg total). Take with or immediately following a meal.     Cardiovascular:  Beta Blockers Failed - 05/24/2023 11:38 AM      Failed - Valid encounter within last 6 months    Recent Outpatient Visits           2 years ago Uncontrolled type 2 diabetes mellitus with hyperglycemia (HCC)   [facility] Pickard, Priscille Heidelberg, MD   2 years ago Atrial fibrillation, unspecified type [facility])   [facility] Pickard, Priscille Heidelberg, MD   3 years ago Candidal intertrigo   [facility] Donita Brooks, MD   3 years ago New onset type 2 diabetes mellitus (HCC)   [facility] Donita Brooks, MD   4 years ago New onset type 2 diabetes mellitus (HCC)   [facility] Pickard, Priscille Heidelberg, MD       Future Appointments             In 2 months Pickard, Priscille Heidelberg, MD [facility], [facility]            Passed - Last BP in normal range    BP Readings from Last 1 Encounters:  02/27/23 126/78         Passed - Last Heart Rate in normal range    Pulse Readings from Last 1 Encounters:  02/27/23 66          simvastatin (ZOCOR) 40 MG tablet 30 tablet 2    Sig: Take 1 tablet (40 mg total) by mouth daily.     Cardiovascular:  Antilipid - Statins Failed - 05/24/2023 11:38 AM      Failed - Valid encounter within last 12 months    Recent Outpatient Visits           2 years ago Uncontrolled type 2 diabetes mellitus with hyperglycemia (HCC)   [facility] Pickard, Priscille Heidelberg, MD   2 years ago Atrial fibrillation, unspecified type [facility])   [facility] Pickard, Priscille Heidelberg, MD   3 years ago Candidal intertrigo   [facility] Donita Brooks, MD   3 years ago New onset type 2 diabetes mellitus (HCC)   [facility]icine Donita Brooks, MD   4 years ago New onset type 2 diabetes mellitus (HCC)   [facility] Pickard, Priscille Heidelberg, MD       Future Appointments             In 2 months Pickard, Priscille Heidelberg, MD [facility], [facility]            Failed - Lipid Panel in normal range within the last 12 months    Cholesterol  Date Value Ref Range Status  02/27/2023 144 <200 mg/dL Final   LDL Cholesterol (Calc)  Date Value Ref Range Status  02/27/2023 75 mg/dL (calc) Final    Comment:    Reference range: <100 . Desirable range <100 mg/dL for primary prevention;   <70 mg/dL for patients with CHD or diabetic patients  with > or = 2 CHD risk factors. Marland Kitchen LDL-C is now calculated using the Martin-Hopkins  calculation, which is a validated novel method providing  better accuracy than the Friedewald equation  in the  estimation of LDL-C.  Horald Pollen et al. Lenox Ahr. 0102;725(36): 2061-2068  (http://education.[employer].com/faq/FAQ164)    HDL  Date Value Ref Range Status  02/27/2023 40 > OR = 40 mg/dL Final   Triglycerides  Date Value Ref Range Status  02/27/2023 197 (H) <150 mg/dL Final         Passed - Patient is not pregnant

## 2023-06-23 ENCOUNTER — Other Ambulatory Visit: Payer: Self-pay | Admitting: Family Medicine

## 2023-06-25 NOTE — Telephone Encounter (Signed)
 Requested medication (s) are due for refill today- yes  Requested medication (s) are on the active medication list -yes  Future visit scheduled -yes  Last refill: 03/01/23 #30  Notes to clinic: non delegated Rx  Requested Prescriptions  Pending Prescriptions Disp Refills   ALPRAZolam  (XANAX ) 1 MG tablet [Pharmacy Med Name: ALPRAZOLAM  1 MG TABLET] 30 tablet 0    Sig: TAKE 1/2 TABLET BY MOUTH UP TO EVERY 8 HOURS AS NEEDED     Not Delegated - Psychiatry: Anxiolytics/Hypnotics 2 Failed - 06/25/2023  9:34 AM      Failed - This refill cannot be delegated      Failed - Urine Drug Screen completed in last 360 days      Failed - Valid encounter within last 6 months    Recent Outpatient Visits           2 years ago Uncontrolled type 2 diabetes mellitus with hyperglycemia (HCC)   [facility] Medicine Pickard, Butler DASEN, MD   2 years ago Atrial fibrillation, unspecified type (HCC)   [facility] Medicine Pickard, Butler DASEN, MD   3 years ago Candidal intertrigo   [facility] Duanne Butler DASEN, MD   3 years ago New onset type 2 diabetes mellitus (HCC)   [facility] Duanne Butler DASEN, MD   4 years ago New onset type 2 diabetes mellitus (HCC)   [facility] Pickard, Butler DASEN, MD       Future Appointments             In 1 month Pickard, Butler DASEN, MD [facility], [facility]            Passed - Patient is not pregnant         Requested Prescriptions  Pending Prescriptions Disp Refills   ALPRAZolam  (XANAX ) 1 MG tablet [Pharmacy Med Name: ALPRAZOLAM  1 MG TABLET] 30 tablet 0    Sig: TAKE 1/2 TABLET BY MOUTH UP TO EVERY 8 HOURS AS NEEDED     Not Delegated - Psychiatry: Anxiolytics/Hypnotics 2 Failed - 06/25/2023  9:34 AM      Failed - This refill cannot be delegated      Failed - Urine Drug Screen completed in last 360 days      Failed - Valid encounter within last 6 months    Recent Outpatient  Visits           2 years ago Uncontrolled type 2 diabetes mellitus with hyperglycemia (HCC)   [facility] Pickard, Butler DASEN, MD   2 years ago Atrial fibrillation, unspecified type [facility])   [facility] Medicine Pickard, Butler DASEN, MD   3 years ago Candidal intertrigo   [facility] Duanne Butler DASEN, MD   3 years ago New onset type 2 diabetes mellitus (HCC)   [facility] Duanne Butler DASEN, MD   4 years ago New onset type 2 diabetes mellitus (HCC)   [facility] Medicine Pickard, Butler DASEN, MD       Future Appointments             In 1 month Pickard, Butler DASEN, MD [facility] Family [facility], [facility]            Passed - Patient is not pregnant

## 2023-06-26 ENCOUNTER — Other Ambulatory Visit: Payer: Self-pay

## 2023-06-26 NOTE — Telephone Encounter (Signed)
 Prescription Request  06/26/2023  LOV: 02/27/23  What is the name of the medication or equipment? ALPRAZolam  (XANAX ) 1 MG tablet [[id number]]   Have you contacted your pharmacy to request a refill? Yes   Which pharmacy would you like this sent to?  [id number] [place], [place] - [address] [place] [address] Phone: [phone] Fax: [phone]    Patient notified that their request is being sent to the clinical staff for review and that they should receive a response within 2 business days.   Please advise at [facility] [phone]

## 2023-06-27 ENCOUNTER — Other Ambulatory Visit: Payer: Self-pay | Admitting: Family Medicine

## 2023-06-27 ENCOUNTER — Other Ambulatory Visit: Payer: Self-pay

## 2023-06-27 DIAGNOSIS — E7800 Pure hypercholesterolemia, unspecified: Secondary | ICD-10-CM

## 2023-06-27 NOTE — Telephone Encounter (Signed)
 Prescription Request  06/27/2023  LOV: 02/27/23  What is the name of the medication or equipment? lisinopril  (ZESTRIL ) 10 MG tablet [[id number]]   Have you contacted your pharmacy to request a refill? Yes   Which pharmacy would you like this sent to?  [id number] [place], [place] - [address] [place] [address] Phone: [phone] Fax: [phone]    Patient notified that their request is being sent to the clinical staff for review and that they should receive a response within 2 business days.   Please advise at [facility] [phone]

## 2023-06-27 NOTE — Telephone Encounter (Signed)
 Requested medication (s) are due for refill today - yes  Requested medication (s) are on the active medication list -yes  Future visit scheduled -yes  Last refill: 03/01/23 #30  Notes to clinic: non delegated Rx  Requested Prescriptions  Pending Prescriptions Disp Refills   ALPRAZolam  (XANAX ) 1 MG tablet 30 tablet 0    Sig: TAKE 1/2 TABLET BY MOUTH UP TO EVERY 8 HOURS AS NEEDED     Not Delegated - Psychiatry: Anxiolytics/Hypnotics 2 Failed - 06/27/2023  8:44 AM      Failed - This refill cannot be delegated      Failed - Urine Drug Screen completed in last 360 days      Failed - Valid encounter within last 6 months    Recent Outpatient Visits           2 years ago Uncontrolled type 2 diabetes mellitus with hyperglycemia (HCC)   [facility] Medicine Pickard, Butler DASEN, MD   2 years ago Atrial fibrillation, unspecified type (HCC)   [facility] Pickard, Butler DASEN, MD   3 years ago Candidal intertrigo   [facility] Duanne Butler DASEN, MD   3 years ago New onset type 2 diabetes mellitus (HCC)   [facility] Duanne Butler DASEN, MD   4 years ago New onset type 2 diabetes mellitus (HCC)   [facility] Medicine Pickard, Butler DASEN, MD       Future Appointments             In 1 month Pickard, Butler DASEN, MD [facility], [facility]            Passed - Patient is not pregnant         Requested Prescriptions  Pending Prescriptions Disp Refills   ALPRAZolam  (XANAX ) 1 MG tablet 30 tablet 0    Sig: TAKE 1/2 TABLET BY MOUTH UP TO EVERY 8 HOURS AS NEEDED     Not Delegated - Psychiatry: Anxiolytics/Hypnotics 2 Failed - 06/27/2023  8:44 AM      Failed - This refill cannot be delegated      Failed - Urine Drug Screen completed in last 360 days      Failed - Valid encounter within last 6 months    Recent Outpatient Visits           2 years ago Uncontrolled type 2 diabetes mellitus with hyperglycemia  (HCC)   [facility] Pickard, Butler DASEN, MD   2 years ago Atrial fibrillation, unspecified type [facility])   [facility] Medicine Pickard, Butler DASEN, MD   3 years ago Candidal intertrigo   [facility] Duanne Butler DASEN, MD   3 years ago New onset type 2 diabetes mellitus (HCC)   [facility] Medicine Duanne Butler DASEN, MD   4 years ago New onset type 2 diabetes mellitus (HCC)   [facility] Family Medicine Pickard, Butler DASEN, MD       Future Appointments             In 1 month Pickard, Butler DASEN, MD [facility] Family Medicine, [facility]            Passed - Patient is not pregnant

## 2023-06-28 MED ORDER — ALPRAZOLAM 1 MG PO TABS: ORAL_TABLET | ORAL | 0 refills | Status: DC

## 2023-06-29 MED ORDER — LISINOPRIL 10 MG PO TABS: 10.0000 mg | ORAL_TABLET | Freq: Every day | ORAL | 0 refills | Status: DC

## 2023-06-29 NOTE — Telephone Encounter (Signed)
 Requested Prescriptions  Pending Prescriptions Disp Refills   lisinopril  (ZESTRIL ) 10 MG tablet 90 tablet 0    Sig: Take 1 tablet (10 mg total) by mouth daily.     Cardiovascular:  ACE Inhibitors Failed - 06/29/2023  7:57 PM      Failed - Valid encounter within last 6 months    Recent Outpatient Visits           2 years ago Uncontrolled type 2 diabetes mellitus with hyperglycemia (HCC)   [facility] Medicine Pickard, Butler DASEN, MD   2 years ago Atrial fibrillation, unspecified type [facility])   [facility] Family Medicine Duanne Butler DASEN, MD   3 years ago Candidal intertrigo   [facility] Medicine Duanne Butler DASEN, MD   3 years ago New onset type 2 diabetes mellitus (HCC)   [facility] Duanne Butler DASEN, MD   4 years ago New onset type 2 diabetes mellitus (HCC)   [facility] Family Medicine Pickard, Butler DASEN, MD       Future Appointments             In 1 month Pickard, Butler DASEN, MD [facility] Medicine, [facility]            Passed - Cr in normal range and within 180 days    Creat  Date Value Ref Range Status  02/27/2023 0.89 0.70 - 1.35 mg/dL Final   Creatinine, Urine  Date Value Ref Range Status  02/27/2023 230 20 - 320 mg/dL Final         Passed - K in normal range and within 180 days    Potassium  Date Value Ref Range Status  02/27/2023 4.2 3.5 - 5.3 mmol/L Final         Passed - Patient is not pregnant      Passed - Last BP in normal range    BP Readings from Last 1 Encounters:  02/27/23 126/78

## 2023-07-10 ENCOUNTER — Ambulatory Visit: Payer: 59 | Admitting: Family Medicine

## 2023-07-10 ENCOUNTER — Encounter: Payer: Self-pay | Admitting: Family Medicine

## 2023-07-10 VITALS — BP 120/80 | HR 83 | Temp 98.8°F | Ht 66.0 in | Wt 244.0 lb

## 2023-07-10 DIAGNOSIS — J029 Acute pharyngitis, unspecified: Secondary | ICD-10-CM

## 2023-07-10 NOTE — Progress Notes (Signed)
Subjective:    Patient ID: Steven Klein, male    DOB: 09/01/1960, 63 y.o.   MRN: [id number]  HPI   Symptoms began 3 days ago with a sore throat.  He has an occasional nonproductive cough.  He denies any fever.  He denies any sinus pain.  He denies any ear pain.  He denies any chest pain or pleurisy.  The cough is nonproductive.  He denies any shortness of breath.  He denies any nausea or vomiting.  Strep test is negative. Past Medical History:  Diagnosis Date   Adenomatous colon polyp    Anxiety and depression    Atrial fibrillation (HCC)    Cardiomegaly    Depression    Diabetes mellitus without complication (HCC)    Hyperlipidemia    Metabolic syndrome X    Panic attack    Past Surgical History:  Procedure Laterality Date   COLONOSCOPY W/ POLYPECTOMY     ORIF ANKLE FRACTURE     Current Outpatient Medications on File Prior to Visit  Medication Sig Dispense Refill   albuterol (VENTOLIN HFA) 108 (90 Base) MCG/ACT inhaler SMARTSIG:1 Puff(s) Via Inhaler Every 4 Hours PRN (Patient not taking: Reported on 02/27/2023)     albuterol (VENTOLIN HFA) 108 (90 Base) MCG/ACT inhaler Inhale 2 puffs into the lungs every 6 (six) hours as needed for wheezing or shortness of breath. 8 g 0   ALPRAZolam (XANAX) 1 MG tablet TAKE 1/2 TABLET BY MOUTH UP TO EVERY 8 HOURS AS NEEDED 30 tablet 0   apixaban (ELIQUIS) 5 MG TABS tablet Take 1 tablet (5 mg total) by mouth 2 (two) times daily. 60 tablet 11   blood glucose meter kit and supplies Dispense based on patient and insurance preference. Use up to 3 times as directed. (FOR ICD-10 E10.9, E11.9). 1 each 0   Blood Glucose Monitoring Suppl (ACCU-CHEK GUIDE ME) w/Device KIT 1 strip by Does not apply route 2 (two) times daily. (Check BS BID - DX E11.9) 1 kit 2   fluticasone (FLONASE) 50 MCG/ACT nasal spray Place 2 sprays into both nostrils daily. 16 g 6   glucose blood (ACCU-CHEK GUIDE) test strip CHECK BLOOD SUGAR TWICE A DAY DX: E11.9 (NEED OFFICE VISIT)  100 strip 0   Lancets MISC Please dispense based on patient and insurance preference. Use as directed to monitor FSBS 2x daily. Dx: E11.9 100 each 3   lisinopril (ZESTRIL) 10 MG tablet Take 1 tablet (10 mg total) by mouth daily. 90 tablet 0   metFORMIN (GLUCOPHAGE-XR) 500 MG 24 hr tablet TAKE 2 TABLETS BY MOUTH EVERY DAY WITH BREAKFAST 180 tablet 1   metoprolol succinate (TOPROL-XL) 50 MG 24 hr tablet Take 1 tablet (50 mg total) by mouth daily. Take with or immediately following a meal. 30 tablet 2   ofloxacin (OCUFLOX) 0.3 % ophthalmic solution SMARTSIG:In Eye(s)     PARoxetine (PAXIL) 30 MG tablet TAKE 1 TABLET BY MOUTH EVERY DAY 90 tablet 1   pioglitazone (ACTOS) 30 MG tablet TAKE 1 TABLET BY MOUTH EVERY DAY 30 tablet 11   predniSONE (DELTASONE) 20 MG tablet 3 tabs poqday 1-2, 2 tabs poqday 3-4, 1 tab poqday 5-6 12 tablet 0   simvastatin (ZOCOR) 40 MG tablet Take 1 tablet (40 mg total) by mouth daily. 30 tablet 7   No current facility-administered medications on file prior to visit.     Allergies  Allergen Reactions   Sulfamethoxazole     Tongue swells   Ezetimibe-Simvastatin  Does not remember   Sulfonamide Derivatives     REACTION: breathing problems   Social History   Socioeconomic History   Marital status: Married    Spouse name: Not on file   Number of children: 1   Years of education: Not on file   Highest education level: Not on file  Occupational History   Occupation: OWNER    Employer: [employer]    Comment: lawn care  Tobacco Use   Smoking status: Every Day   Smokeless tobacco: Never  Substance and Sexual Activity   Alcohol use: No    Alcohol/week: 0.0 standard drinks of alcohol   Drug use: No   Sexual activity: Not on file  Other Topics Concern   Not on file  Social History Narrative   Not on file   Social Drivers of Health   Financial Resource Strain: Not on file  Food Insecurity: Not on file  Transportation Needs: Not on file   Physical Activity: Not on file  Stress: Not on file  Social Connections: Not on file  Intimate Partner Violence: Not on file     Review of Systems  All other systems reviewed and are negative.      Objective:   Physical Exam Vitals reviewed.  Constitutional:      General: He is not in acute distress.    Appearance: He is well-developed. He is not diaphoretic.  HENT:     Right Ear: Tympanic membrane, ear canal and external ear normal.     Left Ear: Tympanic membrane, ear canal and external ear normal.     Nose: Congestion present. No rhinorrhea.     Mouth/Throat:     Pharynx: Posterior oropharyngeal erythema present. No oropharyngeal exudate.  Eyes:     Conjunctiva/sclera: Conjunctivae normal.  Neck:     Thyroid: No thyromegaly.  Cardiovascular:     Rate and Rhythm: Normal rate. Rhythm irregular.     Heart sounds: Normal heart sounds. No murmur heard.    No friction rub. No gallop.  Pulmonary:     Effort: Pulmonary effort is normal. No respiratory distress.     Breath sounds: No wheezing, rhonchi or rales.  Abdominal:     General: Bowel sounds are normal. There is no distension.     Palpations: Abdomen is soft. There is no mass.     Tenderness: There is no abdominal tenderness. There is no guarding or rebound.  Musculoskeletal:     Cervical back: Neck supple.  Lymphadenopathy:     Cervical: No cervical adenopathy.  Neurological:     Mental Status: He is alert and oriented to person, place, and time.     Cranial Nerves: No cranial nerve deficit.     Motor: No abnormal muscle tone.     Coordination: Coordination normal.     Deep Tendon Reflexes: Reflexes are normal and symmetric.           Assessment & Plan:  Sore throat - Plan: STREP GROUP A AG, W/REFLEX TO CULT Patient has a viral upper respiratory infection.  Recommended supportive care only.  He can use cough drops for cough and sore throat.  He can take Tylenol for body aches or fever.  He can use Mucinex  DM for cough or chest congestion.  Anticipate 4 to 5 days for symptoms to resolve

## 2023-07-12 LAB — CULTURE, GROUP A STREP
Micro Number: 15981173
SPECIMEN QUALITY:: ADEQUATE

## 2023-07-12 LAB — STREP GROUP A AG, W/REFLEX TO CULT: Streptococcus Group A AG: NOT DETECTED

## 2023-07-20 ENCOUNTER — Other Ambulatory Visit: Payer: Self-pay | Admitting: Family Medicine

## 2023-08-02 ENCOUNTER — Encounter: Payer: Self-pay | Admitting: Family Medicine

## 2023-08-02 ENCOUNTER — Ambulatory Visit (INDEPENDENT_AMBULATORY_CARE_PROVIDER_SITE_OTHER): Payer: 59 | Admitting: Family Medicine

## 2023-08-02 VITALS — BP 130/82 | HR 63 | Temp 98.0°F | Ht 66.0 in | Wt 242.8 lb

## 2023-08-02 DIAGNOSIS — Z7984 Long term (current) use of oral hypoglycemic drugs: Secondary | ICD-10-CM | POA: Diagnosis not present

## 2023-08-02 DIAGNOSIS — E118 Type 2 diabetes mellitus with unspecified complications: Secondary | ICD-10-CM | POA: Diagnosis not present

## 2023-08-02 NOTE — Progress Notes (Signed)
Subjective:    Patient ID: Steven Klein, male    DOB: [birth year], 63 y.o.   MRN: [id number]  HPI  Patient is still reeling from the loss of his wife.  He is dealing with grief.  However he denies clinical depression.  He denies anhedonia.  He denies trouble with insomnia.  He denies panic attacks.  He is slowly putting his life back in order.  He is in atrial fibrillation today but his heart rate is controlled.  He denies any chest pain or shortness of breath or dyspnea on exertion.  He denies any syncope or near syncope.  He is due today to check his fasting lab work to monitor his diabetes.  He denies any polyuria polydipsia or blurry vision.  Blood pressure today is well-controlled. Past Medical History:  Diagnosis Date   Adenomatous colon polyp    Anxiety and depression    Atrial fibrillation (HCC)    Cardiomegaly    Depression    Diabetes mellitus without complication (HCC)    Hyperlipidemia    Metabolic syndrome X    Panic attack    Past Surgical History:  Procedure Laterality Date   COLONOSCOPY W/ POLYPECTOMY     ORIF ANKLE FRACTURE     Current Outpatient Medications on File Prior to Visit  Medication Sig Dispense Refill   albuterol (VENTOLIN HFA) 108 (90 Base) MCG/ACT inhaler Inhale 2 puffs into the lungs every 6 (six) hours as needed for wheezing or shortness of breath. 8 g 0   ALPRAZolam (XANAX) 1 MG tablet TAKE 1/2 TABLET BY MOUTH UP TO EVERY 8 HOURS AS NEEDED 30 tablet 0   apixaban (ELIQUIS) 5 MG TABS tablet Take 1 tablet (5 mg total) by mouth 2 (two) times daily. 60 tablet 11   blood glucose meter kit and supplies Dispense based on patient and insurance preference. Use up to 3 times as directed. (FOR ICD-10 E10.9, E11.9). 1 each 0   Blood Glucose Monitoring Suppl (ACCU-CHEK GUIDE ME) w/Device KIT 1 strip by Does not apply route 2 (two) times daily. (Check BS BID - DX E11.9) 1 kit 2   fluticasone (FLONASE) 50 MCG/ACT nasal spray Place 2 sprays into both nostrils daily.  16 g 6   glucose blood (ACCU-CHEK GUIDE) test strip CHECK BLOOD SUGAR TWICE A DAY DX: E11.9 (NEED OFFICE VISIT) 100 strip 0   Lancets MISC Please dispense based on patient and insurance preference. Use as directed to monitor FSBS 2x daily. Dx: E11.9 100 each 3   lisinopril (ZESTRIL) 10 MG tablet Take 1 tablet (10 mg total) by mouth daily. 90 tablet 0   metFORMIN (GLUCOPHAGE-XR) 500 MG 24 hr tablet TAKE 2 TABLETS BY MOUTH EVERY DAY WITH BREAKFAST 180 tablet 1   metoprolol succinate (TOPROL-XL) 50 MG 24 hr tablet TAKE 1 TABLET BY MOUTH DAILY. TAKE WITH OR IMMEDIATELY FOLLOWING A MEAL. 90 tablet 1   ofloxacin (OCUFLOX) 0.3 % ophthalmic solution SMARTSIG:In Eye(s)     PARoxetine (PAXIL) 30 MG tablet TAKE 1 TABLET BY MOUTH EVERY DAY 90 tablet 1   pioglitazone (ACTOS) 30 MG tablet TAKE 1 TABLET BY MOUTH EVERY DAY 30 tablet 11   simvastatin (ZOCOR) 40 MG tablet Take 1 tablet (40 mg total) by mouth daily. 30 tablet 7   No current facility-administered medications on file prior to visit.     Allergies  Allergen Reactions   Sulfamethoxazole     Tongue swells   Ezetimibe-Simvastatin     Does not  remember   Sulfonamide Derivatives     REACTION: breathing problems   Social History   Socioeconomic History   Marital status: Married    Spouse name: Not on file   Number of children: 1   Years of education: Not on file   Highest education level: Not on file  Occupational History   Occupation: OWNER    Employer: [employer]    Comment: lawn care  Tobacco Use   Smoking status: Every Day   Smokeless tobacco: Never  Substance and Sexual Activity   Alcohol use: No    Alcohol/week: 0.0 standard drinks of alcohol   Drug use: No   Sexual activity: Not on file  Other Topics Concern   Not on file  Social History Narrative   Not on file   Social Drivers of Health   Financial Resource Strain: Not on file  Food Insecurity: Not on file  Transportation Needs: Not on file  Physical  Activity: Not on file  Stress: Not on file  Social Connections: Not on file  Intimate Partner Violence: Not on file     Review of Systems  All other systems reviewed and are negative.      Objective:   Physical Exam Vitals reviewed.  Constitutional:      General: He is not in acute distress.    Appearance: He is well-developed. He is not diaphoretic.  HENT:     Right Ear: Tympanic membrane, ear canal and external ear normal.     Left Ear: Tympanic membrane, ear canal and external ear normal.     Nose: No congestion or rhinorrhea.     Mouth/Throat:     Pharynx: No oropharyngeal exudate or posterior oropharyngeal erythema.  Eyes:     Conjunctiva/sclera: Conjunctivae normal.  Neck:     Thyroid: No thyromegaly.  Cardiovascular:     Rate and Rhythm: Normal rate. Rhythm irregular.     Heart sounds: Normal heart sounds. No murmur heard.    No friction rub. No gallop.  Pulmonary:     Effort: Pulmonary effort is normal. No respiratory distress.     Breath sounds: No wheezing, rhonchi or rales.  Abdominal:     General: Bowel sounds are normal. There is no distension.     Palpations: Abdomen is soft. There is no mass.     Tenderness: There is no abdominal tenderness. There is no guarding or rebound.  Musculoskeletal:     Cervical back: Neck supple.     Right lower leg: No edema.     Left lower leg: No edema.  Lymphadenopathy:     Cervical: No cervical adenopathy.  Neurological:     Mental Status: He is alert and oriented to person, place, and time.     Cranial Nerves: No cranial nerve deficit.     Motor: No abnormal muscle tone.     Coordination: Coordination normal.     Deep Tendon Reflexes: Reflexes are normal and symmetric.           Assessment & Plan:  Controlled type 2 diabetes mellitus with complication, without long-term current use of insulin (HCC) - Plan: Hemoglobin A1c, COMPLETE METABOLIC PANEL WITH GFR, CBC with Differential/Platelet, Microalbumin/Creatinine  Ratio, Urine, Lipid panel Very happy with his blood pressure.  The patient is appropriately anticoagulated and rate is controlled regarding his atrial fibrillation.  I will check a hemoglobin A1c sure that his diabetes is well-controlled.  I will check a fasting lipid panel.  I would  like his A1c to be less than 6.5.  I would like his LDL to be less than 100.  I will check a urine protein creatinine ratio to evaluate for any evidence of diabetic nephropathy.  Patient declines any change in his antidepressant at the present time.

## 2023-08-03 LAB — CBC WITH DIFFERENTIAL/PLATELET
Absolute Lymphocytes: 1993 {cells}/uL (ref 850–3900)
Absolute Monocytes: 639 {cells}/uL (ref 200–950)
Basophils Absolute: 47 {cells}/uL (ref 0–200)
Basophils Relative: 0.5 %
Eosinophils Absolute: 235 {cells}/uL (ref 15–500)
Eosinophils Relative: 2.5 %
HCT: 47.7 % (ref 38.5–50.0)
Hemoglobin: 15.2 g/dL (ref 13.2–17.1)
MCH: 28.3 pg (ref 27.0–33.0)
MCHC: 31.9 g/dL — ABNORMAL LOW (ref 32.0–36.0)
MCV: 88.7 fL (ref 80.0–100.0)
MPV: 11.3 fL (ref 7.5–12.5)
Monocytes Relative: 6.8 %
Neutro Abs: 6486 {cells}/uL (ref 1500–7800)
Neutrophils Relative %: 69 %
Platelets: 267 10*3/uL (ref 140–400)
RBC: 5.38 10*6/uL (ref 4.20–5.80)
RDW: 13 % (ref 11.0–15.0)
Total Lymphocyte: 21.2 %
WBC: 9.4 10*3/uL (ref 3.8–10.8)

## 2023-08-03 LAB — COMPLETE METABOLIC PANEL WITH GFR
AG Ratio: 1.5 (calc) (ref 1.0–2.5)
ALT: 11 U/L (ref 9–46)
AST: 16 U/L (ref 10–35)
Albumin: 4.3 g/dL (ref 3.6–5.1)
Alkaline phosphatase (APISO): 89 U/L (ref 35–144)
BUN: 9 mg/dL (ref 7–25)
CO2: 23 mmol/L (ref 20–32)
Calcium: 9.7 mg/dL (ref 8.6–10.3)
Chloride: 106 mmol/L (ref 98–110)
Creat: 0.9 mg/dL (ref 0.70–1.35)
Globulin: 2.9 g/dL (ref 1.9–3.7)
Glucose, Bld: 95 mg/dL (ref 65–99)
Potassium: 4.7 mmol/L (ref 3.5–5.3)
Sodium: 145 mmol/L (ref 135–146)
Total Bilirubin: 1 mg/dL (ref 0.2–1.2)
Total Protein: 7.2 g/dL (ref 6.1–8.1)
eGFR: 96 mL/min/{1.73_m2} (ref >=60)

## 2023-08-03 LAB — MICROALBUMIN / CREATININE URINE RATIO
Creatinine, Urine: 109 mg/dL (ref 20–320)
Microalb Creat Ratio: 10 mg/g{creat} (ref <30)
Microalb, Ur: 1.1 mg/dL

## 2023-08-03 LAB — HEMOGLOBIN A1C
Hgb A1c MFr Bld: 6.6 %{Hb} — ABNORMAL HIGH (ref <5.7)
Mean Plasma Glucose: 143 mg/dL
eAG (mmol/L): 7.9 mmol/L

## 2023-08-03 LAB — LIPID PANEL
Cholesterol: 141 mg/dL (ref <200)
HDL: 39 mg/dL — ABNORMAL LOW (ref >=40)
LDL Cholesterol (Calc): 72 mg/dL
Non-HDL Cholesterol (Calc): 102 mg/dL (ref <130)
Total CHOL/HDL Ratio: 3.6 (calc) (ref <5.0)
Triglycerides: 208 mg/dL — ABNORMAL HIGH (ref <150)

## 2023-08-26 ENCOUNTER — Other Ambulatory Visit: Payer: Self-pay | Admitting: Family Medicine

## 2023-08-28 MED ORDER — ALPRAZOLAM 1 MG PO TABS: ORAL_TABLET | ORAL | 0 refills | Status: DC

## 2023-08-28 NOTE — Telephone Encounter (Signed)
 Requested medication (s) are due for refill today: Yes  Requested medication (s) are on the active medication list: Yes  Last refill:  08/28/23  Future visit scheduled: Yes  Notes to clinic:  Unable to refill per protocol, cannot delegate.      Requested Prescriptions  Pending Prescriptions Disp Refills   ALPRAZolam (XANAX) 1 MG tablet [Pharmacy Med Name: ALPRAZOLAM 1 MG TABLET] 30 tablet 0    Sig: TAKE 1/2 TABLET BY MOUTH UP TO EVERY 8 HOURS AS NEEDED     Not Delegated - Psychiatry: Anxiolytics/Hypnotics 2 Failed - 08/28/2023  9:06 AM      Failed - This refill cannot be delegated      Failed - Urine Drug Screen completed in last 360 days      Failed - Valid encounter within last 6 months    Recent Outpatient Visits           2 years ago Uncontrolled type 2 diabetes mellitus with hyperglycemia (HCC)   [facility] Pickard, Priscille Heidelberg, MD   2 years ago Atrial fibrillation, unspecified type [facility])   [facility] Pickard, Priscille Heidelberg, MD   3 years ago Candidal intertrigo   [facility] Donita Brooks, MD   3 years ago New onset type 2 diabetes mellitus (HCC)   [facility] Donita Brooks, MD   5 years ago New onset type 2 diabetes mellitus (HCC)   [facility] Pickard, Priscille Heidelberg, MD              Passed - Patient is not pregnant

## 2023-10-11 ENCOUNTER — Other Ambulatory Visit: Payer: Self-pay

## 2023-10-11 DIAGNOSIS — E7800 Pure hypercholesterolemia, unspecified: Secondary | ICD-10-CM

## 2023-10-11 NOTE — Telephone Encounter (Signed)
 Prescription Request  10/11/2023  LOV: 08/02/23  What is the name of the medication or equipment? lisinopril  (ZESTRIL ) 10 MG tablet [[id number]]   Have you contacted your pharmacy to request a refill? Yes   Which pharmacy would you like this sent to?  [id number] Jonette Nestle, [place] - [address] [place] [address] Phone: [phone] Fax: [phone]    Patient notified that their request is being sent to the clinical staff for review and that they should receive a response within 2 business days.   Please advise at [facility] [phone]

## 2023-10-12 MED ORDER — LISINOPRIL 10 MG PO TABS: 10.0000 mg | ORAL_TABLET | Freq: Every day | ORAL | 1 refills | Status: DC

## 2023-10-12 NOTE — Telephone Encounter (Signed)
 Requested Prescriptions  Pending Prescriptions Disp Refills   lisinopril  (ZESTRIL ) 10 MG tablet 90 tablet 1    Sig: Take 1 tablet (10 mg total) by mouth daily.     Cardiovascular:  ACE Inhibitors Passed - 10/12/2023  9:22 AM      Passed - Cr in normal range and within 180 days    Creat  Date Value Ref Range Status  08/02/2023 0.90 0.70 - 1.35 mg/dL Final   Creatinine, Urine  Date Value Ref Range Status  08/02/2023 109 20 - 320 mg/dL Final         Passed - K in normal range and within 180 days    Potassium  Date Value Ref Range Status  08/02/2023 4.7 3.5 - 5.3 mmol/L Final         Passed - Patient is not pregnant      Passed - Last BP in normal range    BP Readings from Last 1 Encounters:  08/02/23 130/82         Passed - Valid encounter within last 6 months    Recent Outpatient Visits           2 months ago Controlled type 2 diabetes mellitus with complication, without long-term current use of insulin (HCC)   [facility] Pickard, Cisco Crest, MD   3 months ago Sore throat   [facility] Austine Lefort, MD   7 months ago Controlled type 2 diabetes mellitus with complication, without long-term current use of insulin [facility])   [facility] Pickard, Cisco Crest, MD   1 year ago Atrial fibrillation, unspecified type [facility])   [facility] Pickard, Cisco Crest, MD

## 2023-10-18 DIAGNOSIS — L570 Actinic keratosis: Secondary | ICD-10-CM | POA: Diagnosis not present

## 2023-10-18 DIAGNOSIS — L538 Other specified erythematous conditions: Secondary | ICD-10-CM | POA: Diagnosis not present

## 2023-10-18 DIAGNOSIS — L821 Other seborrheic keratosis: Secondary | ICD-10-CM | POA: Diagnosis not present

## 2023-10-18 DIAGNOSIS — L918 Other hypertrophic disorders of the skin: Secondary | ICD-10-CM | POA: Diagnosis not present

## 2023-10-18 DIAGNOSIS — D225 Melanocytic nevi of trunk: Secondary | ICD-10-CM | POA: Diagnosis not present

## 2023-10-18 DIAGNOSIS — R208 Other disturbances of skin sensation: Secondary | ICD-10-CM | POA: Diagnosis not present

## 2023-10-18 DIAGNOSIS — L814 Other melanin hyperpigmentation: Secondary | ICD-10-CM | POA: Diagnosis not present

## 2023-10-21 ENCOUNTER — Other Ambulatory Visit: Payer: Self-pay | Admitting: Family Medicine

## 2023-10-22 MED ORDER — ALPRAZOLAM 1 MG PO TABS: ORAL_TABLET | ORAL | 0 refills | Status: DC

## 2023-11-04 ENCOUNTER — Encounter: Payer: Self-pay | Admitting: Family Medicine

## 2023-11-05 ENCOUNTER — Other Ambulatory Visit: Payer: Self-pay

## 2023-11-05 MED ORDER — PAROXETINE HCL 30 MG PO TABS: 30.0000 mg | ORAL_TABLET | Freq: Every day | ORAL | 1 refills | Status: DC

## 2023-12-14 ENCOUNTER — Other Ambulatory Visit: Payer: Self-pay

## 2023-12-14 ENCOUNTER — Ambulatory Visit: Payer: Self-pay

## 2023-12-14 DIAGNOSIS — M545 Low back pain, unspecified: Secondary | ICD-10-CM | POA: Insufficient documentation

## 2023-12-14 DIAGNOSIS — Z7901 Long term (current) use of anticoagulants: Secondary | ICD-10-CM | POA: Insufficient documentation

## 2023-12-14 DIAGNOSIS — R310 Gross hematuria: Secondary | ICD-10-CM | POA: Insufficient documentation

## 2023-12-14 DIAGNOSIS — R399 Unspecified symptoms and signs involving the genitourinary system: Secondary | ICD-10-CM | POA: Diagnosis not present

## 2023-12-14 DIAGNOSIS — R319 Hematuria, unspecified: Secondary | ICD-10-CM | POA: Diagnosis not present

## 2023-12-14 LAB — URINALYSIS, ROUTINE W REFLEX MICROSCOPIC
Bacteria, UA: NONE SEEN
Bilirubin Urine: NEGATIVE
Glucose, UA: NEGATIVE mg/dL
Ketones, ur: NEGATIVE mg/dL
Nitrite: NEGATIVE
Protein, ur: 30 mg/dL — AB
RBC / HPF: >50 RBC/hpf (ref 0–5)
Specific Gravity, Urine: 1.011 (ref 1.005–1.030)
pH: 6.5 (ref 5.0–8.0)

## 2023-12-14 LAB — CBC
HCT: 45 % (ref 39.0–52.0)
Hemoglobin: 15.6 g/dL (ref 13.0–17.0)
MCH: 31.1 pg (ref 26.0–34.0)
MCHC: 34.7 g/dL (ref 30.0–36.0)
MCV: 89.8 fL (ref 80.0–100.0)
Platelets: 205 10*3/uL (ref 150–400)
RBC: 5.01 MIL/uL (ref 4.22–5.81)
RDW: 13.6 % (ref 11.5–15.5)
WBC: 11 10*3/uL — ABNORMAL HIGH (ref 4.0–10.5)
nRBC: 0 % (ref 0.0–0.2)

## 2023-12-14 LAB — BASIC METABOLIC PANEL WITH GFR
Anion gap: 11 (ref 5–15)
BUN: 9 mg/dL (ref 8–23)
CO2: 27 mmol/L (ref 22–32)
Calcium: 10 mg/dL (ref 8.9–10.3)
Chloride: 102 mmol/L (ref 98–111)
Creatinine, Ser: 1.15 mg/dL (ref 0.61–1.24)
GFR, Estimated: >60 mL/min (ref >60)
Glucose, Bld: 105 mg/dL — ABNORMAL HIGH (ref 70–99)
Potassium: 3.6 mmol/L (ref 3.5–5.1)
Sodium: 141 mmol/L (ref 135–145)

## 2023-12-14 NOTE — ED Triage Notes (Signed)
 Pt caox4, ambulatory, NAD c/o hematuria multiple times since this morning, recently been having R side back pain. Sent by walkin clinic , urinalysis + for blood.

## 2023-12-14 NOTE — Telephone Encounter (Signed)
  FYI Only or Action Required?: FYI only for provider.  Patient was last seen in primary care on 08/02/2023 by Steven Butler DASEN, MD. Called Nurse Triage reporting Hematuria. Symptoms began several days ago. Interventions attempted: Nothing. Symptoms are: gradually worsening.  Triage Disposition: See HCP Within 4 Hours (Or PCP Triage) To UC Patient/caregiver understands and will follow disposition?: Yes   Copied from CRM [id number]. Topic: Clinical - Red Word Triage >> Dec 14, 2023  1:07 PM Geneva B wrote: [facility] that prompted transfer to Nurse Triage: blood  in urine Reason for Disposition  [1] Pain or burning with passing urine AND [2] side (flank) or back pain present  Answer Assessment - Initial Assessment Questions 1. COLOR of URINE: Describe the color of the urine.  (e.g., tea-colored, pink, red, bloody) Do you have blood clots in your urine? (e.g., none, pea, grape, small coin)     Bright red 2. ONSET: When did the bleeding start?      Couple days ago 3. EPISODES: How many times has there been blood in the urine? or How many times today?     Every time 4. PAIN with URINATION: Is there any pain with passing your urine? If Yes, ask: How bad is the pain?  (Scale 1-10; or mild, moderate, severe)    - MILD: Complains slightly about urination hurting.    - MODERATE: Interferes with normal activities.      - SEVERE: Excruciating, unwilling or unable to urinate because of the pain.      no 5. FEVER: Do you have a fever? If Yes, ask: What is your temperature, how was it measured, and when did it start?     denies 6. ASSOCIATED SYMPTOMS: Are you passing urine more frequently than usual?     yes 7. OTHER SYMPTOMS: Do you have any other symptoms? (e.g., back/flank pain, abdomen pain, vomiting)     no 8. PREGNANCY: Is there any chance you are pregnant? When was your last menstrual period?     na  Protocols used: Urine - Blood In-A-AH

## 2023-12-15 ENCOUNTER — Emergency Department (HOSPITAL_BASED_OUTPATIENT_CLINIC_OR_DEPARTMENT_OTHER)

## 2023-12-15 ENCOUNTER — Emergency Department (HOSPITAL_BASED_OUTPATIENT_CLINIC_OR_DEPARTMENT_OTHER)
Admission: EM | Admit: 2023-12-15 | Discharge: 2023-12-15 | Disposition: A | Discharge status: Home / Self Care | Attending: Emergency Medicine | Admitting: Emergency Medicine

## 2023-12-15 DIAGNOSIS — R319 Hematuria, unspecified: Secondary | ICD-10-CM | POA: Diagnosis not present

## 2023-12-15 DIAGNOSIS — R310 Gross hematuria: Secondary | ICD-10-CM

## 2023-12-15 DIAGNOSIS — R109 Unspecified abdominal pain: Secondary | ICD-10-CM | POA: Diagnosis not present

## 2023-12-15 MED ORDER — CEPHALEXIN 500 MG PO CAPS: 500.0000 mg | ORAL_CAPSULE | Freq: Three times a day (TID) | ORAL | 0 refills | Status: DC

## 2023-12-15 MED ORDER — CEPHALEXIN 250 MG PO CAPS
500.0000 mg | ORAL_CAPSULE | Freq: Once | ORAL | Status: AC
  Administered 2023-12-15: 500 mg via ORAL
  Filled 2023-12-15: qty 2

## 2023-12-15 NOTE — ED Provider Notes (Signed)
 [place] EMERGENCY DEPARTMENT AT [facility] Provider Note   CSN: [id number] Arrival date & time: 12/14/23  1813     Patient presents with: Hematuria   Steven Klein is a 63 y.o. male.   Patient presents to the emergency department for evaluation of blood in his urine.  Patient reports that when he woke up this morning and urinated he noticed that there was blood in the urine.  He has had several episodes of blood mixed with normal urine today.  He has slight left lower back pain which may be normal for him.  No significant dysuria with urination.       Prior to Admission medications   Medication Sig Start Date End Date Taking? Authorizing Provider  cephALEXin (KEFLEX) 500 MG capsule Take 1 capsule (500 mg total) by mouth 3 (three) times daily. 12/15/23  Yes Makyia Erxleben, Lonni PARAS, MD  albuterol  (VENTOLIN  HFA) 108 (90 Base) MCG/ACT inhaler Inhale 2 puffs into the lungs every 6 (six) hours as needed for wheezing or shortness of breath. 02/27/23   Duanne Butler DASEN, MD  ALPRAZolam  (XANAX ) 1 MG tablet TAKE 1/2 TABLET BY MOUTH UP TO EVERY 8 HOURS AS NEEDED 10/22/23   Duanne Butler DASEN, MD  apixaban  (ELIQUIS ) 5 MG TABS tablet Take 1 tablet (5 mg total) by mouth 2 (two) times daily. 03/01/23   Duanne Butler DASEN, MD  blood glucose meter kit and supplies Dispense based on patient and insurance preference. Use up to 3 times as directed. (FOR ICD-10 E10.9, E11.9). 04/05/18   Duanne Butler DASEN, MD  Blood Glucose Monitoring Suppl (ACCU-CHEK GUIDE ME) w/Device KIT 1 strip by Does not apply route 2 (two) times daily. (Check BS BID - DX E11.9) 06/04/18   Duanne Butler DASEN, MD  fluticasone  (FLONASE ) 50 MCG/ACT nasal spray Place 2 sprays into both nostrils daily. 02/27/23   Duanne Butler DASEN, MD  glucose blood (ACCU-CHEK GUIDE) test strip CHECK BLOOD SUGAR TWICE A DAY DX: E11.9 (NEED OFFICE VISIT) 03/13/23   Duanne Butler DASEN, MD  Lancets MISC Please dispense based on patient and insurance  preference. Use as directed to monitor FSBS 2x daily. Dx: E11.9 05/28/18   Duanne Butler DASEN, MD  lisinopril  (ZESTRIL ) 10 MG tablet Take 1 tablet (10 mg total) by mouth daily. 10/12/23   Duanne Butler DASEN, MD  metFORMIN  (GLUCOPHAGE -XR) 500 MG 24 hr tablet TAKE 2 TABLETS BY MOUTH EVERY DAY WITH BREAKFAST 03/20/23   Duanne Butler DASEN, MD  metoprolol  succinate (TOPROL -XL) 50 MG 24 hr tablet TAKE 1 TABLET BY MOUTH DAILY. TAKE WITH OR IMMEDIATELY FOLLOWING A MEAL. 07/20/23   Duanne Butler DASEN, MD  ofloxacin (OCUFLOX) 0.3 % ophthalmic solution SMARTSIG:In Eye(s) 07/10/22   [provider]  PARoxetine  (PAXIL ) 30 MG tablet Take 1 tablet (30 mg total) by mouth daily. 11/05/23   Duanne Butler DASEN, MD  pioglitazone  (ACTOS ) 30 MG tablet TAKE 1 TABLET BY MOUTH EVERY DAY 06/27/23   Duanne Butler DASEN, MD  simvastatin  (ZOCOR ) 40 MG tablet Take 1 tablet (40 mg total) by mouth daily. 05/25/23   Duanne Butler DASEN, MD    Allergies: Sulfamethoxazole, Ezetimibe-simvastatin , and Sulfonamide derivatives    Review of Systems  Updated Vital Signs BP (!) 146/72   Pulse (!) 56   Temp 98.5 F (36.9 C) (Oral)   Resp 18   SpO2 98%   Physical Exam Vitals and nursing note reviewed.  Constitutional:      General: He is not in acute distress.  Appearance: He is well-developed.  HENT:     Head: Normocephalic and atraumatic.     Mouth/Throat:     Mouth: Mucous membranes are moist.   Eyes:     General: Vision grossly intact. Gaze aligned appropriately.     Extraocular Movements: Extraocular movements intact.     Conjunctiva/sclera: Conjunctivae normal.    Cardiovascular:     Rate and Rhythm: Normal rate and regular rhythm.     Pulses: Normal pulses.     Heart sounds: Normal heart sounds, S1 normal and S2 normal. No murmur heard.    No friction rub. No gallop.  Pulmonary:     Effort: Pulmonary effort is normal. No respiratory distress.     Breath sounds: Normal breath sounds.  Abdominal:     Palpations:  Abdomen is soft.     Tenderness: There is no abdominal tenderness. There is no guarding or rebound.     Hernia: No hernia is present.   Musculoskeletal:        General: No swelling.     Cervical back: Full passive range of motion without pain, normal range of motion and neck supple. No pain with movement, spinous process tenderness or muscular tenderness. Normal range of motion.     Right lower leg: No edema.     Left lower leg: No edema.   Skin:    General: Skin is warm and dry.     Capillary Refill: Capillary refill takes less than 2 seconds.     Findings: No ecchymosis, erythema, lesion or wound.   Neurological:     Mental Status: He is alert and oriented to person, place, and time.     GCS: GCS eye subscore is 4. GCS verbal subscore is 5. GCS motor subscore is 6.     Cranial Nerves: Cranial nerves 2-12 are intact.     Sensory: Sensation is intact.     Motor: Motor function is intact. No weakness or abnormal muscle tone.     Coordination: Coordination is intact.   Psychiatric:        Mood and Affect: Mood normal.        Speech: Speech normal.        Behavior: Behavior normal.     (all labs ordered are listed, but only abnormal results are displayed) Labs Reviewed  URINALYSIS, ROUTINE W REFLEX MICROSCOPIC - Abnormal; Notable for the following components:      Result Value   APPearance HAZY (*)    Hgb urine dipstick LARGE (*)    Protein, ur 30 (*)    Leukocytes,Ua TRACE (*)    All other components within normal limits  BASIC METABOLIC PANEL WITH GFR - Abnormal; Notable for the following components:   Glucose, Bld 105 (*)    All other components within normal limits  CBC - Abnormal; Notable for the following components:   WBC 11.0 (*)    All other components within normal limits  URINE CULTURE    EKG: None  Radiology: CT RENAL STONE STUDY Result Date: 12/15/2023 CLINICAL DATA:  Abdominal/flank pain, stone suspected. Hematuria. Right-sided back pain. EXAM: CT  ABDOMEN AND PELVIS WITHOUT CONTRAST TECHNIQUE: Multidetector CT imaging of the abdomen and pelvis was performed following the standard protocol without IV contrast. RADIATION DOSE REDUCTION: This exam was performed according to the departmental dose-optimization program which includes automated exposure control, adjustment of the mA and/or kV according to patient size and/or use of iterative reconstruction technique. COMPARISON:  None Available. FINDINGS: Lower chest:  No acute abnormality. Hepatobiliary: Unremarkable liver. Normal gallbladder. No biliary dilation. Pancreas: Unremarkable. Spleen: Unremarkable. Adrenals/Urinary Tract: Normal adrenal glands. No urinary calculi or hydronephrosis. Bladder is unremarkable. Stomach/Bowel: Normal caliber large and small bowel. No bowel wall thickening. The appendix is normal.Stomach is within normal limits. Vascular/Lymphatic: No significant vascular findings are present. No enlarged abdominal or pelvic lymph nodes. Reproductive: Unremarkable. Other: No free intraperitoneal fluid or air. Musculoskeletal: No acute fracture. IMPRESSION: No acute abnormality in the abdomen or pelvis. No CT correlate for hematuria. Electronically Signed   By: Norman Gatlin M.D.   On: 12/15/2023 01:40     Procedures   Medications Ordered in the ED  cephALEXin (KEFLEX) capsule 500 mg (has no administration in time range)                                    Medical Decision Making Amount and/or Complexity of Data Reviewed Labs: ordered. Radiology: ordered.   Patient presents to the emergency department for evaluation of gross blood in his urine that has occurred multiple times today.  Urinalysis equivocal for infection, will culture.  Patient not experiencing any significant discomfort.  He does report slight left lower back pain but this might be chronic musculoskeletal in nature.  CT scan does not show any evidence of ureterolithiasis.  No masses or other explanation for  hematuria.  Will treat empirically with antibiotics but have patient follow-up with urology for repeat evaluation of hematuria.     Final diagnoses:  Gross hematuria    ED Discharge Orders          Ordered    cephALEXin (KEFLEX) 500 MG capsule  3 times daily        12/15/23 0145               Haze Lonni PARAS, MD 12/15/23 [phone]

## 2023-12-16 LAB — URINE CULTURE: Culture: NO GROWTH

## 2023-12-23 ENCOUNTER — Other Ambulatory Visit: Payer: Self-pay | Admitting: Family Medicine

## 2024-01-03 ENCOUNTER — Other Ambulatory Visit: Payer: Self-pay | Admitting: Family Medicine

## 2024-01-04 NOTE — Telephone Encounter (Signed)
 Requested Prescriptions  Pending Prescriptions Disp Refills   metFORMIN  (GLUCOPHAGE -XR) 500 MG 24 hr tablet [Pharmacy Med Name: METFORMIN  HCL ER 500 MG TABLET] 180 tablet 0    Sig: TAKE 2 TABLETS BY MOUTH EVERY DAY WITH BREAKFAST     Endocrinology:  Diabetes - Biguanides Failed - 01/04/2024 11:36 AM      Failed - B12 Level in normal range and within 720 days    No results found for: VITAMINB12       Passed - Cr in normal range and within 360 days    Creat  Date Value Ref Range Status  08/02/2023 0.90 0.70 - 1.35 mg/dL Final   Creatinine, Ser  Date Value Ref Range Status  12/14/2023 1.15 0.61 - 1.24 mg/dL Final   Creatinine, Urine  Date Value Ref Range Status  08/02/2023 109 20 - 320 mg/dL Final         Passed - HBA1C is between 0 and 7.9 and within 180 days    Hgb A1c MFr Bld  Date Value Ref Range Status  08/02/2023 6.6 (H) <5.7 % of total Hgb Final    Comment:    For someone without known diabetes, a hemoglobin A1c value of 6.5% or greater indicates that they may have  diabetes and this should be confirmed with a follow-up  test. . For someone with known diabetes, a value <7% indicates  that their diabetes is well controlled and a value  greater than or equal to 7% indicates suboptimal  control. A1c targets should be individualized based on  duration of diabetes, age, comorbid conditions, and  other considerations. . Currently, no consensus exists regarding use of hemoglobin A1c for diagnosis of diabetes for children. .          Passed - eGFR in normal range and within 360 days    GFR, Est African American  Date Value Ref Range Status  10/25/2020 107 > OR = 60 mL/min/1.53m2 Final   GFR, Est Non African American  Date Value Ref Range Status  10/25/2020 93 > OR = 60 mL/min/1.56m2 Final   GFR, Estimated  Date Value Ref Range Status  12/14/2023 >60 >60 mL/min Final    Comment:    (NOTE) Calculated using the CKD-EPI Creatinine Equation (2021)    eGFR   Date Value Ref Range Status  08/02/2023 96 > OR = 60 mL/min/1.51m2 Final         Passed - Valid encounter within last 6 months    Recent Outpatient Visits           5 months ago Controlled type 2 diabetes mellitus with complication, without long-term current use of insulin (HCC)   [place] [facility] Pickard, Butler DASEN, MD   5 months ago Sore throat   [place] [facility] Duanne Butler DASEN, MD   10 months ago Controlled type 2 diabetes mellitus with complication, without long-term current use of insulin [facility])   [place] [facility] Pickard, Butler DASEN, MD   2 years ago Atrial fibrillation, unspecified type [facility])   [place] [facility] Pickard, Butler DASEN, MD              Passed - CBC within normal limits and completed in the last 12 months    WBC  Date Value Ref Range Status  12/14/2023 11.0 (H) 4.0 - 10.5 K/uL Final   RBC  Date Value Ref Range Status  12/14/2023 5.01 4.22 -  5.81 MIL/uL Final   Hemoglobin  Date Value Ref Range Status  12/14/2023 15.6 13.0 - 17.0 g/dL Final   HCT  Date Value Ref Range Status  12/14/2023 45.0 39.0 - 52.0 % Final   MCHC  Date Value Ref Range Status  12/14/2023 34.7 30.0 - 36.0 g/dL Final   [facility]  Date Value Ref Range Status  12/14/2023 31.1 26.0 - 34.0 pg Final   MCV  Date Value Ref Range Status  12/14/2023 89.8 80.0 - 100.0 fL Final   No results found for: PLTCOUNTKUC, LABPLAT, POCPLA RDW  Date Value Ref Range Status  12/14/2023 13.6 11.5 - 15.5 % Final

## 2024-01-18 ENCOUNTER — Other Ambulatory Visit: Payer: Self-pay | Admitting: Family Medicine

## 2024-01-18 NOTE — Telephone Encounter (Signed)
 Requested Prescriptions  Pending Prescriptions Disp Refills   simvastatin  (ZOCOR ) 40 MG tablet [Pharmacy Med Name: SIMVASTATIN  40 MG TABLET] 30 tablet 5    Sig: TAKE 1 TABLET BY MOUTH EVERY DAY     Cardiovascular:  Antilipid - Statins Failed - 01/18/2024  2:10 PM      Failed - Lipid Panel in normal range within the last 12 months    Cholesterol  Date Value Ref Range Status  08/02/2023 141 <200 mg/dL Final   LDL Cholesterol (Calc)  Date Value Ref Range Status  08/02/2023 72 mg/dL (calc) Final    Comment:    Reference range: <100 . Desirable range <100 mg/dL for primary prevention;   <70 mg/dL for patients with CHD or diabetic patients  with > or = 2 CHD risk factors. SABRA LDL-C is now calculated using the Martin-Hopkins  calculation, which is a validated novel method providing  better accuracy than the Friedewald equation in the  estimation of LDL-C.  Gladis APPLETHWAITE et al. SANDREA. 7986;689(80): 2061-2068  (http://education.[employer].com/faq/FAQ164)    HDL  Date Value Ref Range Status  08/02/2023 39 (L) > OR = 40 mg/dL Final   Triglycerides  Date Value Ref Range Status  08/02/2023 208 (H) <150 mg/dL Final    Comment:    . If a non-fasting specimen was collected, consider repeat triglyceride testing on a fasting specimen if clinically indicated.  Veatrice et al. J. of Clin. Lipidol. 2015;9:129-169. SABRA          Passed - Patient is not pregnant      Passed - Valid encounter within last 12 months    Recent Outpatient Visits           5 months ago Controlled type 2 diabetes mellitus with complication, without long-term current use of insulin (HCC)   [place] [facility] Pickard, Butler DASEN, MD   6 months ago Sore throat   [place] [facility] Duanne Butler DASEN, MD   10 months ago Controlled type 2 diabetes mellitus with complication, without long-term current use of insulin [facility])   [place] [facility] Duanne Butler DASEN, MD   2 years ago Atrial fibrillation, unspecified type [facility])   [place] [facility] Pickard, Butler DASEN, MD               fluticasone  (FLONASE ) 50 MCG/ACT nasal spray [Pharmacy Med Name: FLUTICASONE  PROP 50 MCG SPRAY] 16 mL 2    Sig: SPRAY 2 SPRAYS INTO EACH NOSTRIL EVERY DAY     Ear, Nose, and Throat: Nasal Preparations - Corticosteroids Passed - 01/18/2024  2:10 PM      Passed - Valid encounter within last 12 months    Recent Outpatient Visits           5 months ago Controlled type 2 diabetes mellitus with complication, without long-term current use of insulin (HCC)   [place] [facility] Pickard, Butler DASEN, MD   6 months ago Sore throat   [place] [facility] Duanne Butler DASEN, MD   10 months ago Controlled type 2 diabetes mellitus with complication, without long-term current use of insulin [facility])   [place] [facility] Duanne Butler DASEN, MD   2 years ago Atrial fibrillation, unspecified type [facility])    [place] [facility] Pickard, Butler DASEN, MD

## 2024-01-18 NOTE — Telephone Encounter (Signed)
 Prescription Request  01/18/2024  LOV: 08/02/2023  What is the name of the medication or equipment?   metoprolol  succinate (TOPROL -XL) 50 MG 24 hr tablet  **90 day script requested**  Have you contacted your pharmacy to request a refill? Yes   Which pharmacy would you like this sent to?  [id number] [address], [place] - [address] [place] [address] Phone: [phone] Fax: [phone]    Patient notified that their request is being sent to the clinical staff for review and that they should receive a response within 2 business days.   Please advise pharmacist.

## 2024-01-21 MED ORDER — METOPROLOL SUCCINATE ER 50 MG PO TB24: 50.0000 mg | ORAL_TABLET | Freq: Every day | ORAL | 0 refills | Status: DC

## 2024-01-21 NOTE — Telephone Encounter (Signed)
 Requested Prescriptions  Pending Prescriptions Disp Refills   metoprolol  succinate (TOPROL -XL) 50 MG 24 hr tablet 90 tablet 0    Sig: Take 1 tablet (50 mg total) by mouth daily. TAKE WITH OR IMMEDIATELY FOLLOWING A MEAL.     Cardiovascular:  Beta Blockers Passed - 01/21/2024  9:33 AM      Passed - Last BP in normal range    BP Readings from Last 1 Encounters:  12/15/23 128/85         Passed - Last Heart Rate in normal range    Pulse Readings from Last 1 Encounters:  12/15/23 72         Passed - Valid encounter within last 6 months    Recent Outpatient Visits           5 months ago Controlled type 2 diabetes mellitus with complication, without long-term current use of insulin (HCC)   [place] [facility] Duanne, Butler DASEN, MD   6 months ago Sore throat   [place] [facility] Duanne Butler DASEN, MD   10 months ago Controlled type 2 diabetes mellitus with complication, without long-term current use of insulin [facility])   [place] [facility] Duanne Butler DASEN, MD   2 years ago Atrial fibrillation, unspecified type [facility])   [place] [facility] Pickard, Butler DASEN, MD

## 2024-02-01 ENCOUNTER — Ambulatory Visit (INDEPENDENT_AMBULATORY_CARE_PROVIDER_SITE_OTHER): Payer: 59 | Admitting: Family Medicine

## 2024-02-01 ENCOUNTER — Encounter: Payer: Self-pay | Admitting: Family Medicine

## 2024-02-01 VITALS — BP 126/68 | HR 67 | Temp 98.7°F | Ht 66.0 in | Wt 246.6 lb

## 2024-02-01 DIAGNOSIS — Z23 Encounter for immunization: Secondary | ICD-10-CM | POA: Diagnosis not present

## 2024-02-01 DIAGNOSIS — E118 Type 2 diabetes mellitus with unspecified complications: Secondary | ICD-10-CM | POA: Diagnosis not present

## 2024-02-01 DIAGNOSIS — Z0000 Encounter for general adult medical examination without abnormal findings: Secondary | ICD-10-CM | POA: Diagnosis not present

## 2024-02-01 DIAGNOSIS — Z1211 Encounter for screening for malignant neoplasm of colon: Secondary | ICD-10-CM

## 2024-02-01 DIAGNOSIS — R399 Unspecified symptoms and signs involving the genitourinary system: Secondary | ICD-10-CM | POA: Insufficient documentation

## 2024-02-01 DIAGNOSIS — Z0001 Encounter for general adult medical examination with abnormal findings: Secondary | ICD-10-CM | POA: Diagnosis not present

## 2024-02-01 DIAGNOSIS — E7800 Pure hypercholesterolemia, unspecified: Secondary | ICD-10-CM

## 2024-02-01 DIAGNOSIS — I480 Paroxysmal atrial fibrillation: Secondary | ICD-10-CM | POA: Diagnosis not present

## 2024-02-01 DIAGNOSIS — Z122 Encounter for screening for malignant neoplasm of respiratory organs: Secondary | ICD-10-CM

## 2024-02-01 NOTE — Progress Notes (Signed)
 Subjective:    Patient ID: Steven Klein, male    DOB: 03/10/1961, 63 y.o.   MRN: [id number]  Patient is a very pleasant 63 year old Caucasian gentleman here today for complete physical exam.  He has known calcification in his coronary arteries indicative of coronary atherosclerosis.  Unfortunately he continues to smoke.  He is due for lung cancer screening.  He is also overdue for colon cancer screening.  The patient is due for his second shingles vaccine as well as his tetanus shot.  He continues to have symptoms of depression after the death of his wife.  We discussed starting medication for depression today however he he does not feel that he wants to go this route.  He denies any chest pain or shortness of breath or dyspnea exertion. Past Medical History:  Diagnosis Date   Adenomatous colon polyp    Anxiety and depression    Atrial fibrillation (HCC)    Cardiomegaly    Depression    Diabetes mellitus without complication (HCC)    Hyperlipidemia    Metabolic syndrome X    Panic attack    Past Surgical History:  Procedure Laterality Date   COLONOSCOPY W/ POLYPECTOMY     ORIF ANKLE FRACTURE     Current Outpatient Medications on File Prior to Visit  Medication Sig Dispense Refill   albuterol (VENTOLIN HFA) 108 (90 Base) MCG/ACT inhaler Inhale 2 puffs into the lungs every 6 (six) hours as needed for wheezing or shortness of breath. 8 g 0   ALPRAZolam (XANAX) 1 MG tablet TAKE 1/2 TABLET BY MOUTH UP TO EVERY 8 HOURS AS NEEDED 30 tablet 0   apixaban (ELIQUIS) 5 MG TABS tablet Take 1 tablet (5 mg total) by mouth 2 (two) times daily. 60 tablet 11   blood glucose meter kit and supplies Dispense based on patient and insurance preference. Use up to 3 times as directed. (FOR ICD-10 E10.9, E11.9). 1 each 0   Blood Glucose Monitoring Suppl (ACCU-CHEK GUIDE ME) w/Device KIT 1 strip by Does not apply route 2 (two) times daily. (Check BS BID - DX E11.9) 1 kit 2   fluticasone (FLONASE) 50 MCG/ACT  nasal spray SPRAY 2 SPRAYS INTO EACH NOSTRIL EVERY DAY 16 mL 2   glucose blood (ACCU-CHEK GUIDE) test strip CHECK BLOOD SUGAR TWICE A DAY DX: E11.9 (NEED OFFICE VISIT) 100 strip 0   Lancets MISC Please dispense based on patient and insurance preference. Use as directed to monitor FSBS 2x daily. Dx: E11.9 100 each 3   lisinopril (ZESTRIL) 10 MG tablet Take 1 tablet (10 mg total) by mouth daily. 90 tablet 1   metFORMIN (GLUCOPHAGE-XR) 500 MG 24 hr tablet TAKE 2 TABLETS BY MOUTH EVERY DAY WITH BREAKFAST 180 tablet 0   metoprolol succinate (TOPROL-XL) 50 MG 24 hr tablet Take 1 tablet (50 mg total) by mouth daily. TAKE WITH OR IMMEDIATELY FOLLOWING A MEAL. 90 tablet 0   ofloxacin (OCUFLOX) 0.3 % ophthalmic solution SMARTSIG:In Eye(s)     PARoxetine (PAXIL) 30 MG tablet Take 1 tablet (30 mg total) by mouth daily. 90 tablet 1   pioglitazone (ACTOS) 30 MG tablet TAKE 1 TABLET BY MOUTH EVERY DAY 30 tablet 11   simvastatin (ZOCOR) 40 MG tablet TAKE 1 TABLET BY MOUTH EVERY DAY 30 tablet 5   cephALEXin (KEFLEX) 500 MG capsule Take 1 capsule (500 mg total) by mouth 3 (three) times daily. (Patient not taking: Reported on 02/01/2024) 21 capsule 0   No current facility-administered  medications on file prior to visit.     Allergies  Allergen Reactions   Sulfamethoxazole     Tongue swells   Ezetimibe-Simvastatin     Does not remember   Sulfonamide Derivatives     REACTION: breathing problems   Social History   Socioeconomic History   Marital status: Widowed    Spouse name: Not on file   Number of children: 1   Years of education: Not on file   Highest education level: Not on file  Occupational History   Occupation: OWNER    Employer: [employer]    Comment: lawn care  Tobacco Use   Smoking status: Every Day   Smokeless tobacco: Never  Substance and Sexual Activity   Alcohol use: No    Alcohol/week: 0.0 standard drinks of alcohol   Drug use: No   Sexual activity: Not on file  Other  Topics Concern   Not on file  Social History Narrative   Not on file   Social Drivers of Health   Financial Resource Strain: Not on file  Food Insecurity: Not on file  Transportation Needs: Not on file  Physical Activity: Not on file  Stress: Not on file  Social Connections: Not on file  Intimate Partner Violence: Not on file     Review of Systems  Psychiatric/Behavioral:  Positive for depression.   All other systems reviewed and are negative.      Objective:   Physical Exam Vitals reviewed.  Constitutional:      General: He is not in acute distress.    Appearance: He is well-developed. He is not diaphoretic.  HENT:     Right Ear: External ear normal.     Left Ear: External ear normal.     Nose: Congestion and rhinorrhea present.     Mouth/Throat:     Pharynx: No oropharyngeal exudate.  Eyes:     Conjunctiva/sclera: Conjunctivae normal.  Neck:     Thyroid: No thyromegaly.  Cardiovascular:     Rate and Rhythm: Normal rate. Rhythm irregular.     Heart sounds: Normal heart sounds. No murmur heard.    No friction rub. No gallop.  Pulmonary:     Effort: Pulmonary effort is normal. No respiratory distress.     Breath sounds: Wheezing present. No rales.  Abdominal:     General: Bowel sounds are normal. There is no distension.     Palpations: Abdomen is soft. There is no mass.     Tenderness: There is no abdominal tenderness. There is no guarding or rebound.  Musculoskeletal:     Cervical back: Neck supple.  Lymphadenopathy:     Cervical: No cervical adenopathy.  Neurological:     Mental Status: He is alert and oriented to person, place, and time.     Cranial Nerves: No cranial nerve deficit.     Motor: No abnormal muscle tone.     Coordination: Coordination normal.     Deep Tendon Reflexes: Reflexes are normal and symmetric.           Assessment & Plan:   Screening for lung cancer - Plan: CT CHEST LUNG CA SCREEN LOW DOSE W/O CM  Pure  hypercholesterolemia - Plan: Hemoglobin A1c, CBC with Differential/Platelet, Comprehensive metabolic panel with GFR, Lipid panel, PSA, Microalbumin/Creatinine Ratio, Urine  Paroxysmal atrial fibrillation (HCC) - Plan: Hemoglobin A1c, CBC with Differential/Platelet, Comprehensive metabolic panel with GFR, Lipid panel, PSA, Microalbumin/Creatinine Ratio, Urine  Controlled type 2 diabetes mellitus with complication, without  long-term current use of insulin (HCC) - Plan: Hemoglobin A1c, CBC with Differential/Platelet, Comprehensive metabolic panel with GFR, Lipid panel, PSA, Microalbumin/Creatinine Ratio, Urine  General medical exam - Plan: Hemoglobin A1c, CBC with Differential/Platelet, Comprehensive metabolic panel with GFR, Lipid panel, PSA, Microalbumin/Creatinine Ratio, Urine  Patient has diffuse wheezing in his lungs indicative of emphysema.  He also has known coronary atherosclerosis.  He denies any angina.  We discussed a coronary artery calcium score however I explained to the patient that we know he has blockages in his heart.  The next step would be treatment if he develops chest pain.  Therefore we need to reduce his risk factors to help prevent further progression.  Recommended strongly smoking cessation.  Blood pressure is well-controlled today.  I would also like to check his cholesterol.  I would like his LDL cholesterol to be less than 55 and I would like to see his hemoglobin A1c less than 6.5.  Previous fracture in the left foot with scarring visible today on diabetic foot exam.  Schedule the patient for a colonoscopy as well as CT scan to screen for lung cancer yes ma'am, pulm

## 2024-02-02 LAB — COMPREHENSIVE METABOLIC PANEL WITH GFR
AG Ratio: 1.7 (calc) (ref 1.0–2.5)
ALT: 12 U/L (ref 9–46)
AST: 14 U/L (ref 10–35)
Albumin: 4.1 g/dL (ref 3.6–5.1)
Alkaline phosphatase (APISO): 69 U/L (ref 35–144)
BUN: 10 mg/dL (ref 7–25)
CO2: 30 mmol/L (ref 20–32)
Calcium: 9.4 mg/dL (ref 8.6–10.3)
Chloride: 104 mmol/L (ref 98–110)
Creat: 0.91 mg/dL (ref 0.70–1.35)
Globulin: 2.4 g/dL (ref 1.9–3.7)
Glucose, Bld: 89 mg/dL (ref 65–99)
Potassium: 3.7 mmol/L (ref 3.5–5.3)
Sodium: 141 mmol/L (ref 135–146)
Total Bilirubin: 1.4 mg/dL — ABNORMAL HIGH (ref 0.2–1.2)
Total Protein: 6.5 g/dL (ref 6.1–8.1)
eGFR: 95 mL/min/1.73m2 (ref >=60)

## 2024-02-02 LAB — CBC WITH DIFFERENTIAL/PLATELET
Absolute Lymphocytes: 1981 {cells}/uL (ref 850–3900)
Absolute Monocytes: 735 {cells}/uL (ref 200–950)
Basophils Absolute: 74 {cells}/uL (ref 0–200)
Basophils Relative: 0.8 %
Eosinophils Absolute: 233 {cells}/uL (ref 15–500)
Eosinophils Relative: 2.5 %
HCT: 48.1 % (ref 38.5–50.0)
Hemoglobin: 15.7 g/dL (ref 13.2–17.1)
MCH: 30.4 pg (ref 27.0–33.0)
MCHC: 32.6 g/dL (ref 32.0–36.0)
MCV: 93 fL (ref 80.0–100.0)
MPV: 10.9 fL (ref 7.5–12.5)
Monocytes Relative: 7.9 %
Neutro Abs: 6278 {cells}/uL (ref 1500–7800)
Neutrophils Relative %: 67.5 %
Platelets: 211 Thousand/uL (ref 140–400)
RBC: 5.17 Million/uL (ref 4.20–5.80)
RDW: 12.7 % (ref 11.0–15.0)
Total Lymphocyte: 21.3 %
WBC: 9.3 Thousand/uL (ref 3.8–10.8)

## 2024-02-02 LAB — MICROALBUMIN / CREATININE URINE RATIO
Creatinine, Urine: 115 mg/dL (ref 20–320)
Microalb Creat Ratio: 5 mg/g{creat} (ref <30)
Microalb, Ur: 0.6 mg/dL

## 2024-02-02 LAB — LIPID PANEL
Cholesterol: 137 mg/dL (ref <200)
HDL: 37 mg/dL — ABNORMAL LOW (ref >=40)
LDL Cholesterol (Calc): 73 mg/dL
Non-HDL Cholesterol (Calc): 100 mg/dL (ref <130)
Total CHOL/HDL Ratio: 3.7 (calc) (ref <5.0)
Triglycerides: 168 mg/dL — ABNORMAL HIGH (ref <150)

## 2024-02-02 LAB — HEMOGLOBIN A1C
Hgb A1c MFr Bld: 6 % — ABNORMAL HIGH (ref <5.7)
Mean Plasma Glucose: 126 mg/dL
eAG (mmol/L): 7 mmol/L

## 2024-02-02 LAB — PSA: PSA: 0.43 ng/mL (ref <=4.00)

## 2024-02-04 ENCOUNTER — Ambulatory Visit: Payer: Self-pay | Admitting: Family Medicine

## 2024-02-20 ENCOUNTER — Encounter: Payer: Self-pay | Admitting: Family Medicine

## 2024-02-28 ENCOUNTER — Other Ambulatory Visit: Payer: Self-pay | Admitting: Family Medicine

## 2024-02-28 ENCOUNTER — Encounter: Payer: Self-pay | Admitting: Family Medicine

## 2024-02-28 DIAGNOSIS — E7800 Pure hypercholesterolemia, unspecified: Secondary | ICD-10-CM

## 2024-02-29 ENCOUNTER — Other Ambulatory Visit: Payer: Self-pay | Admitting: Family Medicine

## 2024-02-29 MED ORDER — ALPRAZOLAM 1 MG PO TABS: ORAL_TABLET | ORAL | 3 refills | Status: DC

## 2024-03-06 ENCOUNTER — Ambulatory Visit
Admission: RE | Admit: 2024-03-06 | Discharge: 2024-03-06 | Disposition: A | Discharge status: Home / Self Care | Source: Ambulatory Visit | Attending: Family Medicine | Admitting: Family Medicine

## 2024-03-06 DIAGNOSIS — F17210 Nicotine dependence, cigarettes, uncomplicated: Secondary | ICD-10-CM | POA: Diagnosis not present

## 2024-03-06 DIAGNOSIS — Z122 Encounter for screening for malignant neoplasm of respiratory organs: Secondary | ICD-10-CM

## 2024-03-11 ENCOUNTER — Other Ambulatory Visit: Payer: Self-pay | Admitting: Family Medicine

## 2024-03-11 DIAGNOSIS — I480 Paroxysmal atrial fibrillation: Secondary | ICD-10-CM

## 2024-03-13 ENCOUNTER — Ambulatory Visit: Payer: Self-pay

## 2024-03-13 NOTE — Telephone Encounter (Signed)
 This RN made 1st attempt to reach patients daughter. LVM with callback number.   Copied from CRM #[id number]. Topic: Clinical - Pink Word Triage >> Mar 13, 2024  4:07 PM Antony RAMAN wrote: Reason for Triage: possible bronchitis and chest congestion >> Mar 13, 2024  4:08 PM Antony RAMAN wrote: Pts daughter called regarding pt Steven Klein having possible bronchitis with chest congestion, requesting antibiotics before office visit

## 2024-03-13 NOTE — Telephone Encounter (Signed)
 FYI Only or Action Required?: FYI only for provider.  Patient was last seen in primary care on 02/01/2024 by Duanne Butler DASEN, MD.  Called Nurse Triage reporting Facial Pain.  Symptoms began several weeks ago.  Interventions attempted: OTC medications: Tylenol.  Symptoms are: gradually worsening.  Triage Disposition: See PCP When Office is Open (Within 3 Days)  Patient/caregiver understands and will follow disposition?: Yes   Reason for Disposition  Lots of coughing  Answer Assessment - Initial Assessment Questions Taking tylenol for symptoms. Chest congestion and cough now.   1. ONSET: When did the sinus pain start?  (e.g., hours, days)      3-4 weeks  2. RECURRENT SYMPTOM: Have you ever had sinus problems before? If Yes, ask: When was the last time? and What happened that time?      Yes  3. NASAL CONGESTION: Is the nose blocked? If Yes, ask: Can you open it or must you breathe through your mouth?     Only at night time  4. NASAL DISCHARGE: Do you have discharge from your nose? If so ask, What color?     Denies in the last few days  5. FEVER: Do you have a fever? If Yes, ask: What is it, how was it measured, and when did it start?      Denies  6. OTHER SYMPTOMS: Do you have any other symptoms? (e.g., sore throat, cough, earache, difficulty breathing)     Cough, sinus drainage  Protocols used: Sinus Pain or Congestion-A-AH

## 2024-03-13 NOTE — Telephone Encounter (Signed)
 This RN made the 1st attempt to contact patient. No answer, unable to leave a voicemail.      Message from Kendralyn S sent at 03/13/2024  4:08 PM EDT  Pts daughter called regarding pt Steven Klein having possible bronchitis with chest congestion, requesting antibiotics before office visit

## 2024-03-13 NOTE — Telephone Encounter (Signed)
 FYI Only or Action Required?: Action required by provider: clinical question for provider.  Patient was last seen in primary care on 02/01/2024 by Duanne Butler DASEN, MD.  Called Nurse Triage reporting URI.  Symptoms began several weeks ago.  Interventions attempted: OTC medications: Coricidin.  Symptoms are: unchanged.  Triage Disposition: Discuss With PCP and Callback by Nurse Today (overriding See HCP Within 4 Hours (Or PCP Triage))  Patient/caregiver understands and will follow disposition?: Yes Reason for Disposition  [1] MILD difficulty breathing (e.g., minimal/no SOB at rest, SOB with walking, pulse < 100) AND [2] still present when not coughing  (Exception: No change from usual, chronic shortness of breath.)  Answer Assessment - Initial Assessment Questions Cough, runny nose, congestion x 2 1/2 weeks. Daughter concerned that he may have bronchitis d/t CT results. Already has appt Monday. Family asking if he needs to be seen or if he can be prescribed antibiotics based the CT results.   1. ONSET: When did the cough begin?      2 weeks  2. SEVERITY: How bad is the cough today?      States sounds deep  3. SPUTUM: Describe the color of your sputum (e.g., none, dry cough; clear, white, yellow, green)     Yes, unknown color  4. HEMOPTYSIS: Are you coughing up any blood? If so ask: How much? (e.g., flecks, streaks, tablespoons, etc.)     Denies  5. DIFFICULTY BREATHING: Are you having difficulty breathing? If Yes, ask: How bad is it? (e.g., mild, moderate, severe)      States is SOB more than his baseline with exertion  6. FEVER: Do you have a fever? If Yes, ask: What is your temperature, how was it measured, and when did it start?     Denies  Protocols used: Cough - Chronic-A-AH

## 2024-03-14 ENCOUNTER — Other Ambulatory Visit: Payer: Self-pay | Admitting: Family Medicine

## 2024-03-14 MED ORDER — AMOXICILLIN 875 MG PO TABS: 875.0000 mg | ORAL_TABLET | Freq: Two times a day (BID) | ORAL | 0 refills | Status: AC

## 2024-03-17 ENCOUNTER — Ambulatory Visit: Admitting: Family Medicine

## 2024-04-17 ENCOUNTER — Encounter: Payer: Self-pay | Admitting: Nurse Practitioner

## 2024-04-22 DIAGNOSIS — L814 Other melanin hyperpigmentation: Secondary | ICD-10-CM | POA: Diagnosis not present

## 2024-04-22 DIAGNOSIS — D2262 Melanocytic nevi of left upper limb, including shoulder: Secondary | ICD-10-CM | POA: Diagnosis not present

## 2024-04-22 DIAGNOSIS — D2261 Melanocytic nevi of right upper limb, including shoulder: Secondary | ICD-10-CM | POA: Diagnosis not present

## 2024-04-22 DIAGNOSIS — D225 Melanocytic nevi of trunk: Secondary | ICD-10-CM | POA: Diagnosis not present

## 2024-04-22 DIAGNOSIS — L570 Actinic keratosis: Secondary | ICD-10-CM | POA: Diagnosis not present

## 2024-04-22 DIAGNOSIS — L821 Other seborrheic keratosis: Secondary | ICD-10-CM | POA: Diagnosis not present

## 2024-04-25 ENCOUNTER — Other Ambulatory Visit: Payer: Self-pay | Admitting: Family Medicine

## 2024-04-25 NOTE — Telephone Encounter (Signed)
 Requested Prescriptions  Pending Prescriptions Disp Refills   metoprolol  succinate (TOPROL -XL) 50 MG 24 hr tablet [Pharmacy Med Name: METOPROLOL  SUCC ER 50 MG TAB] 30 tablet 2    Sig: TAKE 1 TABLET BY MOUTH DAILY. TAKE WITH OR IMMEDIATELY FOLLOWING A MEAL.     Cardiovascular:  Beta Blockers Passed - 04/25/2024  5:28 PM      Passed - Last BP in normal range    BP Readings from Last 1 Encounters:  02/01/24 126/68         Passed - Last Heart Rate in normal range    Pulse Readings from Last 1 Encounters:  02/01/24 67         Passed - Valid encounter within last 6 months    Recent Outpatient Visits           2 months ago Screening for lung cancer   [place] [facility] Duanne Butler DASEN, MD   8 months ago Controlled type 2 diabetes mellitus with complication, without long-term current use of insulin [facility])   [place] [facility] Duanne, Butler DASEN, MD   9 months ago Sore throat   [place] [facility] Duanne Butler DASEN, MD   1 year ago Controlled type 2 diabetes mellitus with complication, without long-term current use of insulin [facility])   [place] [facility] Duanne Butler DASEN, MD   2 years ago Atrial fibrillation, unspecified type [facility])    [facility] [place], Butler DASEN, MD

## 2024-04-27 ENCOUNTER — Encounter: Payer: Self-pay | Admitting: Family Medicine

## 2024-04-28 ENCOUNTER — Other Ambulatory Visit: Payer: Self-pay

## 2024-04-28 MED ORDER — ALPRAZOLAM 1 MG PO TABS
ORAL_TABLET | ORAL | 3 refills | Status: AC
Start: 2024-04-28 — End: ?

## 2024-06-04 ENCOUNTER — Ambulatory Visit: Admitting: Nurse Practitioner

## 2024-06-04 ENCOUNTER — Telehealth: Payer: Self-pay

## 2024-06-04 ENCOUNTER — Encounter: Payer: Self-pay | Admitting: Nurse Practitioner

## 2024-06-04 VITALS — BP 146/88 | HR 74 | Ht 66.0 in | Wt 263.0 lb

## 2024-06-04 DIAGNOSIS — Z860101 Personal history of adenomatous and serrated colon polyps: Secondary | ICD-10-CM

## 2024-06-04 DIAGNOSIS — Z09 Encounter for follow-up examination after completed treatment for conditions other than malignant neoplasm: Secondary | ICD-10-CM

## 2024-06-04 DIAGNOSIS — Z860100 Personal history of colon polyps, unspecified: Secondary | ICD-10-CM

## 2024-06-04 MED ORDER — NA SULFATE-K SULFATE-MG SULF 17.5-3.13-1.6 GM/177ML PO SOLN: 1.0000 | Freq: Once | ORAL | 0 refills | Status: AC

## 2024-06-04 NOTE — Progress Notes (Signed)
 Noted

## 2024-06-04 NOTE — Telephone Encounter (Signed)
 [facility] Pre-operative Risk Assessment     Request for surgical clearance:     Endoscopy Procedure  What type of surgery is being performed?     Colonoscopy  When is this surgery scheduled?     07/09/2024  What type of clearance is required ?   Pharmacy  Are there any medications that need to be held prior to surgery and how long? Eliquis  - 2 days  Practice name and name of physician performing surgery?       Gastroenterology  What is your office phone and fax number?      Phone- [phone]  Fax- [phone]  Anesthesia type (None, local, MAC, general) ?       MAC   Please route your response to [facility]

## 2024-06-04 NOTE — Progress Notes (Signed)
 06/04/2024 Steven Klein [id number] 1960-07-14   CHIEF COMPLAINT: Schedule a colonoscopy   HISTORY OF PRESENT ILLNESS: Steven Klein is a 63 year old male with a past medical history of  anxiety, depression, hypertension, atrial fibrillation on Eliquis , DM type II and colon polyps. Previously known by Dr. Aneita. He presents today to schedule a colonoscopy. He denies having any upper or lower abdominal pain. He typically passes a normal formed stool once daily. He gets diarrhea if he eats Mexican food or Chinese food. No bloody or black stools. He underwent a colonoscopy 04/2015 which identified 3 tubular adenomatous polyps removed from the colon. He was advised to repeat a colonoscopy in 3 years. He stated deferring a colonoscopy due to the Covid pandemic then his wife of 40 years unexpectedly passed away from complications from sepsis. He wishes to schedule a colonoscopy at this time. Family history unknown, patient was adopted.   History of atrial fibrillation on Eliquis . No NSAID use. He denies having any CP, dizziness, palpitations or SOB.      Latest Ref Rng & Units 02/01/2024    8:53 AM 12/14/2023    6:37 PM 08/02/2023   10:09 AM  CBC  WBC 3.8 - 10.8 Thousand/uL 9.3  11.0  9.4   Hemoglobin 13.2 - 17.1 g/dL 84.2  84.3  84.7   Hematocrit 38.5 - 50.0 % 48.1  45.0  47.7   Platelets 140 - 400 Thousand/uL 211  205  267        Latest Ref Rng & Units 02/01/2024    8:53 AM 12/14/2023    6:37 PM 08/02/2023   10:09 AM  CMP  Glucose 65 - 99 mg/dL 89  894  95   BUN 7 - 25 mg/dL 10  9  9    Creatinine 0.70 - 1.35 mg/dL 9.08  8.84  9.09   Sodium 135 - 146 mmol/L 141  141  145   Potassium 3.5 - 5.3 mmol/L 3.7  3.6  4.7   Chloride 98 - 110 mmol/L 104  102  106   CO2 20 - 32 mmol/L 30  27  23    Calcium 8.6 - 10.3 mg/dL 9.4  89.9  9.7   Total Protein 6.1 - 8.1 g/dL 6.5   7.2   Total Bilirubin 0.2 - 1.2 mg/dL 1.4   1.0   AST 10 - 35 U/L 14   16   ALT 9 - 46 U/L 12   11      Colonoscopy 05/19/2015: 1. Three sessile polyps in the sigmoid, transverse, and ascending colon; polypectomies performed with a cold snare  2. The examination was otherwise normal  Recall colonoscopy 3 years Surgical [P], sigmoid, transverse and ascending, polyp (3) - TUBULAR ADENOMA(X3). - HIGH GRADE DYSPLASIA IS NOT IDENTIFIED.   Past Medical History:  Diagnosis Date   Adenomatous colon polyp    Anxiety and depression    Atrial fibrillation (HCC)    Cardiomegaly    Depression    Diabetes mellitus without complication (HCC)    Hyperlipidemia    Metabolic syndrome X    Panic attack    Past Surgical History:  Procedure Laterality Date   COLONOSCOPY W/ POLYPECTOMY     ORIF ANKLE FRACTURE     Social History: He is widowed. He has one daughter. Chronic tobacco use. No alcohol use. No drug use.   Family History: Family history unknown, patient was adopted.   Allergies[1]    Outpatient  Encounter Medications as of 06/04/2024  Medication Sig   albuterol  (VENTOLIN  HFA) 108 (90 Base) MCG/ACT inhaler Inhale 2 puffs into the lungs every 6 (six) hours as needed for wheezing or shortness of breath.   ALPRAZolam  (XANAX ) 1 MG tablet TAKE 1/2 TABLET BY MOUTH UP TO EVERY 8 HOURS AS NEEDED   blood glucose meter kit and supplies Dispense based on patient and insurance preference. Use up to 3 times as directed. (FOR ICD-10 E10.9, E11.9).   Blood Glucose Monitoring Suppl (ACCU-CHEK GUIDE ME) w/Device KIT 1 strip by Does not apply route 2 (two) times daily. (Check BS BID - DX E11.9)   cephALEXin  (KEFLEX ) 500 MG capsule Take 1 capsule (500 mg total) by mouth 3 (three) times daily. (Patient not taking: Reported on 02/01/2024)   ELIQUIS  5 MG TABS tablet TAKE 1 TABLET BY MOUTH TWICE A DAY   fluticasone  (FLONASE ) 50 MCG/ACT nasal spray SPRAY 2 SPRAYS INTO EACH NOSTRIL EVERY DAY   glucose blood (ACCU-CHEK GUIDE) test strip CHECK BLOOD SUGAR TWICE A DAY DX: E11.9 (NEED OFFICE VISIT)   Lancets MISC Please  dispense based on patient and insurance preference. Use as directed to monitor FSBS 2x daily. Dx: E11.9   lisinopril  (ZESTRIL ) 10 MG tablet TAKE 1 TABLET BY MOUTH EVERY DAY   metFORMIN  (GLUCOPHAGE -XR) 500 MG 24 hr tablet TAKE 2 TABLETS BY MOUTH EVERY DAY WITH BREAKFAST   metoprolol  succinate (TOPROL -XL) 50 MG 24 hr tablet TAKE 1 TABLET BY MOUTH DAILY. TAKE WITH OR IMMEDIATELY FOLLOWING A MEAL.   ofloxacin (OCUFLOX) 0.3 % ophthalmic solution SMARTSIG:In Eye(s)   PARoxetine  (PAXIL ) 30 MG tablet TAKE 1 TABLET BY MOUTH EVERY DAY   pioglitazone  (ACTOS ) 30 MG tablet TAKE 1 TABLET BY MOUTH EVERY DAY   simvastatin  (ZOCOR ) 40 MG tablet TAKE 1 TABLET BY MOUTH EVERY DAY   No facility-administered encounter medications on file as of 06/04/2024.   REVIEW OF SYSTEMS:  Gen: Denies fever, sweats or chills. No weight loss.  CV: Denies chest pain, palpitations or edema. Resp: Denies cough, shortness of breath of hemoptysis.  GI: Denies heartburn, dysphagia, stomach or lower abdominal pain. No diarrhea or constipation.  GU: Denies urinary burning, blood in urine, increased urinary frequency or incontinence. MS: Denies joint pain, muscles aches or weakness. Derm: Denies rash, itchiness, skin lesions or unhealing ulcers. Psych: + Anxiety and depression.  Heme: Denies bruising, easy bleeding. Neuro:  Denies headaches, dizziness or paresthesias. Endo:  + DM type II.   PHYSICAL EXAM: BP (!) 146/88 (BP Location: Left Arm, Patient Position: Sitting, Cuff Size: Large)   Pulse 74   Ht 5' 6 (1.676 m)   Wt 263 lb (119.3 kg)   BMI 42.45 kg/m   General: 63 year old male in no acute distress. Head: Normocephalic and atraumatic. Eyes:  Sclerae non-icteric, conjunctive pink. Ears: Normal auditory acuity. Mouth: Dentition intact. No ulcers or lesions.  Neck: Supple, no lymphadenopathy or thyromegaly.  Lungs: Clear bilaterally to auscultation without wheezes, crackles or rhonchi. Heart: Regular rate and rhythm.  Distant S1,S2. No murmur, rub or gallop appreciated.  Abdomen: Soft, nontender, nondistended. No masses. No hepatosplenomegaly. Normoactive bowel sounds x 4 quadrants.  Rectal: Deferred.  Musculoskeletal: Symmetrical with no gross deformities. Skin: Warm and dry. No rash or lesions on visible extremities. Extremities: + Left ankle edema ( patient stated was chronic/past ankle surgery).  Neurological: Alert oriented x 4, no focal deficits.  Psychological: Alert and cooperative. Normal mood and affect.  ASSESSMENT AND PLAN:  63  year old male with a history of colon polyps presents to schedule a colonoscopy. Colonoscopy 04/2015 identified 3 tubular adenomatous polyps removed from the colon. - Colonoscopy benefits and risks discussed including risk with sedation, risk of bleeding, perforation and infection  - Our office will contact cardiologist Dr. Delford to verify Eliquis  hold instructions prior to proceeding with a colonoscopy  - Further recommendations to be determined after colonoscopy completed   Atrial fibrillation  - See plan above  DM type II       CC:  Duanne Butler DASEN, MD       [1]  Allergies Allergen Reactions   Sulfamethoxazole     Tongue swells   Ezetimibe-Simvastatin      Does not remember   Sulfonamide Derivatives     REACTION: breathing problems

## 2024-06-04 NOTE — Patient Instructions (Signed)
 You have been scheduled for a colonoscopy. Please follow written instructions given to you at your visit today.   If you use inhalers (even only as needed), please bring them with you on the day of your procedure.  DO NOT TAKE 7 DAYS PRIOR TO TEST- Trulicity (dulaglutide) Ozempic, Wegovy (semaglutide) Mounjaro, Zepbound (tirzepatide) Bydureon Bcise (exanatide extended release)  DO NOT TAKE 1 DAY PRIOR TO YOUR TEST Rybelsus (semaglutide) Adlyxin (lixisenatide) Victoza (liraglutide) Byetta (exanatide) ___________________________________________________________________________  _______________________________________________________  If your blood pressure at your visit was 140/90 or greater, please contact your primary care physician to follow up on this.  _______________________________________________________  If you are age 35 or older, your body mass index should be between 23-30. Your Body mass index is 42.45 kg/m. If this is out of the aforementioned range listed, please consider follow up with your Primary Care Provider.  If you are age 19 or younger, your body mass index should be between 19-25. Your Body mass index is 42.45 kg/m. If this is out of the aformentioned range listed, please consider follow up with your Primary Care Provider.   ________________________________________________________  The [place] providers would like to encourage you to use MYCHART to communicate with providers for non-urgent requests or questions.  Due to long hold times on the telephone, sending your provider a message by [facility] may be a faster and more efficient way to get a response.  Please allow 48 business hours for a response.  Please remember that this is for non-urgent requests.  _______________________________________________________  [facility] is using a team-based approach to care.  Your team is made up of your doctor and two to three APPS. Our APPS (Nurse  Practitioners and Physician Assistants) work with your physician to ensure care continuity for you. They are fully qualified to address your health concerns and develop a treatment plan. They communicate directly with your gastroenterologist to care for you. Seeing the Advanced Practice Practitioners on your physician's team can help you by facilitating care more promptly, often allowing for earlier appointments, access to diagnostic testing, procedures, and other specialty referrals.

## 2024-06-05 NOTE — Telephone Encounter (Signed)
° °  [facility] [address] [place], [place]  [address] Phone:  [phone]   Fax:  [phone]    ANANIAS KOLANDER 02-10-1961 [id number]  06/05/2025  Dear Dr. Duanne:  We have scheduled the above named patient for a(n) endoscopic procedure. Our records show that (s)he is on anticoagulation therapy.  Please advise as to whether the patient may come off their therapy of Eliquis  2 days prior to their procedure which is scheduled for 07/09/2024.  Please route your response to [facility] or fax response to [phone].  Sincerely,    [facility]

## 2024-06-05 NOTE — Telephone Encounter (Signed)
° °  Name: Steven Klein  DOB: 25-Jul-1960  MRN: [id number]   Primary Cardiologist: Maude Emmer, MD  Chart reviewed as part of pre-operative protocol coverage.   Patient has not been seen in our office since August 2019.   Eliquis  prescribed by a noncardiology provider (PCP) therefore recommendations for holding deferred to prescribing provider.    I will route this recommendation to the requesting party via Epic fax function and remove from pre-op pool. Please call with questions.  Barnie Hila, NP 06/05/2024, 10:57 AM

## 2024-06-06 NOTE — Telephone Encounter (Signed)
 Spoke to patient and told him that per cardiology, he could hold his Eliquis for 2 days prior to his procedure.  Patient agreed

## 2024-06-06 NOTE — Telephone Encounter (Signed)
 Lm on vm for patient to call back regarding Eliquis 

## 2024-06-23 ENCOUNTER — Telehealth: Payer: Self-pay | Admitting: Family Medicine

## 2024-06-23 ENCOUNTER — Other Ambulatory Visit: Payer: Self-pay

## 2024-06-23 MED ORDER — PAROXETINE HCL 30 MG PO TABS: 30.0000 mg | ORAL_TABLET | Freq: Every day | ORAL | 2 refills | Status: AC

## 2024-06-23 NOTE — Telephone Encounter (Signed)
 Prescription Request  06/23/2024  LOV: 02/01/2024  What is the name of the medication or equipment?   PARoxetine  (PAXIL ) 30 MG tablet   Have you contacted your pharmacy to request a refill? Yes   Which pharmacy would you like this sent to?  [id number] [place], [place] - [address] [place] [address] Phone: [phone] Fax: [phone]    Patient notified that their request is being sent to the clinical staff for review and that they should receive a response within 2 business days.   Please advise pharmacist.

## 2024-06-23 NOTE — Telephone Encounter (Signed)
 Sent in medication

## 2024-06-26 ENCOUNTER — Telehealth: Payer: Self-pay | Admitting: Family Medicine

## 2024-06-26 ENCOUNTER — Other Ambulatory Visit: Payer: Self-pay

## 2024-06-26 MED ORDER — PIOGLITAZONE HCL 30 MG PO TABS: 30.0000 mg | ORAL_TABLET | Freq: Every day | ORAL | 1 refills | Status: AC

## 2024-06-26 NOTE — Telephone Encounter (Signed)
 Prescription Request  06/26/2024  LOV: 02/01/2024  What is the name of the medication or equipment?   pioglitazone  (ACTOS ) 30 MG tablet   Have you contacted your pharmacy to request a refill? Yes   Which pharmacy would you like this sent to?  [id number] [place], [place] - [address] [place] [address] Phone: [phone] Fax: [phone]    Patient notified that their request is being sent to the clinical staff for review and that they should receive a response within 2 business days.   Please advise pharmacist.

## 2024-06-26 NOTE — Telephone Encounter (Signed)
 Sent in medication

## 2024-07-09 ENCOUNTER — Encounter: Admitting: Internal Medicine

## 2024-07-17 ENCOUNTER — Encounter: Payer: Self-pay | Admitting: Family Medicine

## 2024-07-18 ENCOUNTER — Other Ambulatory Visit: Payer: Self-pay | Admitting: Family Medicine

## 2024-07-18 ENCOUNTER — Telehealth: Payer: Self-pay

## 2024-07-18 ENCOUNTER — Encounter: Payer: Self-pay | Admitting: Family Medicine

## 2024-07-18 ENCOUNTER — Other Ambulatory Visit: Payer: Self-pay | Admitting: Medical Genetics

## 2024-07-18 MED ORDER — CIPROFLOXACIN HCL 500 MG PO TABS: 500.0000 mg | ORAL_TABLET | Freq: Two times a day (BID) | ORAL | 0 refills | Status: AC

## 2024-07-18 NOTE — Telephone Encounter (Signed)
 Four boxes of Eliquis  5 mg tabs given to patient, due to lack of insurance. Lot # [id number] A Exp: 12/16/2025. Mjp,lpn

## 2024-07-23 ENCOUNTER — Other Ambulatory Visit: Payer: Self-pay | Admitting: Medical Genetics

## 2024-07-23 ENCOUNTER — Other Ambulatory Visit: Payer: Self-pay | Admitting: Family Medicine

## 2024-07-23 DIAGNOSIS — Z006 Encounter for examination for normal comparison and control in clinical research program: Secondary | ICD-10-CM

## 2024-07-23 NOTE — Telephone Encounter (Signed)
 Prescription Request  07/23/2024  LOV: 02/01/2024  What is the name of the medication or equipment?   simvastatin  (ZOCOR ) 40 MG tablet   metoprolol  succinate (TOPROL -XL) 50 MG 24 hr tablet [[id number]]   Have you contacted your pharmacy to request a refill? Yes   Which pharmacy would you like this sent to?  [id number] [place], [place] - [address] [place] [address] Phone: [phone] Fax: [phone]    Patient notified that their request is being sent to the clinical staff for review and that they should receive a response within 2 business days.   Please advise pharmacist.

## 2024-07-24 ENCOUNTER — Encounter: Payer: Self-pay | Admitting: Family Medicine

## 2024-07-24 ENCOUNTER — Other Ambulatory Visit: Payer: Self-pay

## 2024-07-24 DIAGNOSIS — I480 Paroxysmal atrial fibrillation: Secondary | ICD-10-CM

## 2024-07-24 DIAGNOSIS — E7800 Pure hypercholesterolemia, unspecified: Secondary | ICD-10-CM

## 2024-07-24 MED ORDER — SIMVASTATIN 40 MG PO TABS: 40.0000 mg | ORAL_TABLET | Freq: Every day | ORAL | 1 refills | Status: AC

## 2024-07-24 MED ORDER — METOPROLOL SUCCINATE ER 50 MG PO TB24: 50.0000 mg | ORAL_TABLET | Freq: Every day | ORAL | 1 refills | Status: AC

## 2024-07-25 ENCOUNTER — Encounter: Payer: Self-pay | Admitting: Family Medicine

## 2024-07-25 ENCOUNTER — Ambulatory Visit: Payer: Self-pay | Admitting: Family Medicine

## 2024-07-25 VITALS — BP 130/80 | HR 88 | Temp 98.3°F | Ht 66.0 in | Wt 267.0 lb

## 2024-07-25 DIAGNOSIS — R350 Frequency of micturition: Secondary | ICD-10-CM

## 2024-07-25 DIAGNOSIS — R310 Gross hematuria: Secondary | ICD-10-CM

## 2024-07-25 LAB — CBC WITH DIFFERENTIAL/PLATELET
Absolute Lymphocytes: 2520 {cells}/uL (ref 850–3900)
Absolute Monocytes: 750 {cells}/uL (ref 200–950)
Basophils Absolute: 50 {cells}/uL (ref 0–200)
Basophils Relative: 0.5 %
Eosinophils Absolute: 300 {cells}/uL (ref 15–500)
Eosinophils Relative: 3 %
HCT: 46 % (ref 39.4–51.1)
Hemoglobin: 15.3 g/dL (ref 13.2–17.1)
MCH: 30.3 pg (ref 27.0–33.0)
MCHC: 33.3 g/dL (ref 31.6–35.4)
MCV: 91.1 fL (ref 81.4–101.7)
MPV: 11.4 fL (ref 7.5–12.5)
Monocytes Relative: 7.5 %
Neutro Abs: 6380 {cells}/uL (ref 1500–7800)
Neutrophils Relative %: 63.8 %
Platelets: 223 10*3/uL (ref 140–400)
RBC: 5.05 Million/uL (ref 4.20–5.80)
RDW: 12.7 % (ref 11.0–15.0)
Total Lymphocyte: 25.2 %
WBC: 10 10*3/uL (ref 3.8–10.8)

## 2024-07-25 LAB — URINALYSIS, ROUTINE W REFLEX MICROSCOPIC
Bacteria, UA: NONE SEEN /HPF
Bilirubin Urine: NEGATIVE
Glucose, UA: NEGATIVE
Hyaline Cast: NONE SEEN /LPF
Ketones, ur: NEGATIVE
Leukocytes,Ua: NEGATIVE
Nitrite: NEGATIVE
Protein, ur: NEGATIVE
Specific Gravity, Urine: 1.015 (ref 1.001–1.035)
Squamous Epithelial / HPF: NONE SEEN /HPF
pH: 7 (ref 5.0–8.0)

## 2024-07-25 LAB — MICROSCOPIC MESSAGE

## 2024-07-25 NOTE — Telephone Encounter (Signed)
 Requested Prescriptions  Refused Prescriptions Disp Refills   simvastatin  (ZOCOR ) 40 MG tablet 30 tablet 5    Sig: Take 1 tablet (40 mg total) by mouth daily.     Cardiovascular:  Antilipid - Statins Failed - 07/25/2024 11:43 AM      Failed - Lipid Panel in normal range within the last 12 months    Cholesterol  Date Value Ref Range Status  02/01/2024 137 <200 mg/dL Final   LDL Cholesterol (Calc)  Date Value Ref Range Status  02/01/2024 73 mg/dL (calc) Final    Comment:    Reference range: <100 . Desirable range <100 mg/dL for primary prevention;   <70 mg/dL for patients with CHD or diabetic patients  with > or = 2 CHD risk factors. SABRA LDL-C is now calculated using the Martin-Hopkins  calculation, which is a validated novel method providing  better accuracy than the Friedewald equation in the  estimation of LDL-C.  Gladis APPLETHWAITE et al. SANDREA. 7986;689(80): 2061-2068  (http://education.[facility].com/faq/FAQ164)    HDL  Date Value Ref Range Status  02/01/2024 37 (L) > OR = 40 mg/dL Final   Triglycerides  Date Value Ref Range Status  02/01/2024 168 (H) <150 mg/dL Final         Passed - Patient is not pregnant      Passed - Valid encounter within last 12 months    Recent Outpatient Visits           5 months ago Screening for lung cancer   [place] [facility] Duanne Butler DASEN, MD   11 months ago Controlled type 2 diabetes mellitus with complication, without long-term current use of insulin [facility])   [place] [facility] Pickard, Butler DASEN, MD   1 year ago Sore throat   [place] [facility] Duanne Butler DASEN, MD   1 year ago Controlled type 2 diabetes mellitus with complication, without long-term current use of insulin [facility])   [place] [facility] Duanne Butler DASEN, MD   2 years ago Atrial fibrillation, unspecified type [facility])   [place] [facility] Pickard, Butler DASEN, MD                metoprolol  succinate (TOPROL -XL) 50 MG 24 hr tablet 30 tablet 2    Sig: Take 1 tablet (50 mg total) by mouth daily. TAKE WITH OR IMMEDIATELY FOLLOWING A MEAL.     Cardiovascular:  Beta Blockers Failed - 07/25/2024 11:43 AM      Failed - Last BP in normal range    BP Readings from Last 1 Encounters:  06/04/24 (!) 146/88         Passed - Last Heart Rate in normal range    Pulse Readings from Last 1 Encounters:  06/04/24 74         Passed - Valid encounter within last 6 months    Recent Outpatient Visits           5 months ago Screening for lung cancer   [place] [facility] Duanne Butler DASEN, MD   11 months ago Controlled type 2 diabetes mellitus with complication, without long-term current use of insulin [facility])   [place] [facility] Pickard, Butler DASEN, MD   1 year ago Sore throat   [place] [facility] Duanne Butler DASEN, MD   1 year ago Controlled type 2 diabetes mellitus with complication, without long-term  current use of insulin [facility])   [place] [facility] Pickard, Butler DASEN, MD   2 years ago Atrial fibrillation, unspecified type [facility])   [place] [facility] Pickard, Butler DASEN, MD

## 2024-07-25 NOTE — Progress Notes (Signed)
 "  Subjective:    Patient ID: Steven Klein, male    DOB: [birth year], 64 y.o.   MRN: [id number]  HPI   Patient was seen in the emergency room December 14, 2023 with gross hematuria.  CT scan showed no evidence of a kidney stone or any abnormality.  Patient was treated empirically with Keflex  for possible urinary tract infection.  However his urine culture revealed no infection.  It was recommended that he follow-up with urology.  Patient's daughter contacted me 1 week ago.  Patient was again experiencing gross hematuria.  Due to lack of insurance they requested an antibiotic be sent in for a possible urinary tract infection.  I did send in Cipro .  However I recommended that if the bleeding persist, the patient would need to be seen by urology for possible cystoscopy.  He is here today for follow-up.  Patient is again experiencing gross hematuria.  He has been experiencing off and on for 1 week.  It is continuing despite taking Cipro .  Urinalysis today shows +2 blood but no nitrates and no leukocyte esterase.  He denies any back pain or nausea or vomiting.  He denies any dysuria.  He is taking Eliquis . Past Medical History:  Diagnosis Date   Adenomatous colon polyp    Anxiety and depression    Atrial fibrillation (HCC)    Cardiomegaly    Depression    Diabetes mellitus without complication (HCC)    Hyperlipidemia    Hypertension    Metabolic syndrome X    Panic attack    Past Surgical History:  Procedure Laterality Date   COLONOSCOPY W/ POLYPECTOMY     ORIF ANKLE FRACTURE     Current Outpatient Medications on File Prior to Visit  Medication Sig Dispense Refill   albuterol  (VENTOLIN  HFA) 108 (90 Base) MCG/ACT inhaler Inhale 2 puffs into the lungs every 6 (six) hours as needed for wheezing or shortness of breath. 8 g 0   ALPRAZolam  (XANAX ) 1 MG tablet TAKE 1/2 TABLET BY MOUTH UP TO EVERY 8 HOURS AS NEEDED 30 tablet 3   blood glucose meter kit and supplies Dispense based on patient and  insurance preference. Use up to 3 times as directed. (FOR ICD-10 E10.9, E11.9). 1 each 0   Blood Glucose Monitoring Suppl (ACCU-CHEK GUIDE ME) w/Device KIT 1 strip by Does not apply route 2 (two) times daily. (Check BS BID - DX E11.9) 1 kit 2   ciprofloxacin  (CIPRO ) 500 MG tablet Take 1 tablet (500 mg total) by mouth 2 (two) times daily for 7 days. 14 tablet 0   ELIQUIS  5 MG TABS tablet TAKE 1 TABLET BY MOUTH TWICE A DAY 60 tablet 11   glucose blood (ACCU-CHEK GUIDE) test strip CHECK BLOOD SUGAR TWICE A DAY DX: E11.9 (NEED OFFICE VISIT) 100 strip 0   Lancets MISC Please dispense based on patient and insurance preference. Use as directed to monitor FSBS 2x daily. Dx: E11.9 100 each 3   lisinopril  (ZESTRIL ) 10 MG tablet TAKE 1 TABLET BY MOUTH EVERY DAY 90 tablet 2   metFORMIN  (GLUCOPHAGE -XR) 500 MG 24 hr tablet TAKE 2 TABLETS BY MOUTH EVERY DAY WITH BREAKFAST 180 tablet 1   metoprolol  succinate (TOPROL -XL) 50 MG 24 hr tablet Take 1 tablet (50 mg total) by mouth daily. TAKE WITH OR IMMEDIATELY FOLLOWING A MEAL. 90 tablet 1   ofloxacin (OCUFLOX) 0.3 % ophthalmic solution SMARTSIG:In Eye(s)     PARoxetine  (PAXIL ) 30 MG tablet Take 1 tablet (30 mg  total) by mouth daily. 90 tablet 2   pioglitazone  (ACTOS ) 30 MG tablet Take 1 tablet (30 mg total) by mouth daily. 90 tablet 1   simvastatin  (ZOCOR ) 40 MG tablet Take 1 tablet (40 mg total) by mouth daily. 90 tablet 1   No current facility-administered medications on file prior to visit.     Allergies  Allergen Reactions   Sulfamethoxazole     Tongue swells   Ezetimibe-Simvastatin      Does not remember   Sulfonamide Derivatives     REACTION: breathing problems   Social History   Socioeconomic History   Marital status: Widowed    Spouse name: Not on file   Number of children: 1   Years of education: Not on file   Highest education level: Not on file  Occupational History   Occupation: OWNER    Employer: [employer]    Comment: lawn  care  Tobacco Use   Smoking status: Every Day   Smokeless tobacco: Never  Vaping Use   Vaping status: Never Used  Substance and Sexual Activity   Alcohol use: No    Alcohol/week: 0.0 standard drinks of alcohol   Drug use: No   Sexual activity: Not on file  Other Topics Concern   Not on file  Social History Narrative   Not on file   Social Drivers of Health   Tobacco Use: High Risk (06/04/2024)   Patient History    Smoking Tobacco Use: Every Day    Smokeless Tobacco Use: Never    Passive Exposure: Not on file  Financial Resource Strain: Not on file  Food Insecurity: Not on file  Transportation Needs: Not on file  Physical Activity: Not on file  Stress: Not on file  Social Connections: Not on file  Intimate Partner Violence: Not on file  Depression (EYV7-0): Medium Risk (02/01/2024)   Depression (PHQ2-9)    PHQ-2 Score: 9  Alcohol Screen: Not on file  Housing: Not on file  Utilities: Not on file  Health Literacy: Not on file     Review of Systems  All other systems reviewed and are negative.      Objective:   Physical Exam Vitals reviewed.  Constitutional:      General: He is not in acute distress.    Appearance: He is well-developed. He is not diaphoretic.  HENT:     Mouth/Throat:     Pharynx: No oropharyngeal exudate or posterior oropharyngeal erythema.  Eyes:     Conjunctiva/sclera: Conjunctivae normal.  Neck:     Thyroid: No thyromegaly.  Cardiovascular:     Rate and Rhythm: Normal rate. Rhythm irregular.     Heart sounds: Normal heart sounds. No murmur heard.    No friction rub. No gallop.  Pulmonary:     Effort: Pulmonary effort is normal. No respiratory distress.     Breath sounds: No wheezing, rhonchi or rales.  Abdominal:     General: Bowel sounds are normal. There is no distension.     Palpations: Abdomen is soft. There is no mass.     Tenderness: There is no abdominal tenderness. There is no guarding or rebound.  Musculoskeletal:      Cervical back: Neck supple.  Lymphadenopathy:     Cervical: No cervical adenopathy.  Neurological:     Mental Status: He is alert and oriented to person, place, and time.     Cranial Nerves: No cranial nerve deficit.     Motor: No abnormal muscle tone.  Coordination: Coordination normal.     Deep Tendon Reflexes: Reflexes are normal and symmetric.           Assessment & Plan:  Urinary frequency - Plan: Urinalysis, Routine w reflex microscopic  Gross hematuria - Plan: Ambulatory referral to Urology, CBC with Differential/Platelet, Comprehensive metabolic panel with GFR Obviously Eliquis  is contributing to his gross hematuria but I explained to the patient we need to perform a cystoscopy to see what the source of the bleeding is and to rule out malignancies in the bladder.  Therefore I recommended urology consultation immediately.  He is able to hold the Eliquis  2 days prior to any cystoscopy.  That would be fine.  I tried to answer any questions that the patient had regarding this.  "

## 2024-08-04 ENCOUNTER — Ambulatory Visit: Payer: Self-pay | Admitting: Orthopaedic Surgery

## 2024-08-12 ENCOUNTER — Encounter: Admitting: Internal Medicine
